# Patient Record
Sex: Female | Born: 1945
Health system: Southern US, Community
[De-identification: ages and names within clinical notes are randomized; demographics above are authoritative.]

## PROBLEM LIST (undated history)

## (undated) DIAGNOSIS — D649 Anemia, unspecified: Secondary | ICD-10-CM

## (undated) DIAGNOSIS — E785 Hyperlipidemia, unspecified: Secondary | ICD-10-CM

## (undated) DIAGNOSIS — I10 Essential (primary) hypertension: Secondary | ICD-10-CM

## (undated) HISTORY — DX: Hyperlipidemia, unspecified: E78.5

## (undated) HISTORY — DX: Anemia, unspecified: D64.9

---
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
## 1969-11-09 HISTORY — PX: BREAST EXCISIONAL BIOPSY: SUR124

## 1989-11-09 HISTORY — PX: BREAST EXCISIONAL BIOPSY: SUR124

## 1995-11-10 HISTORY — PX: LAPAROSCOPIC CHOLECYSTECTOMY: SUR755

## 2009-10-07 ENCOUNTER — Ambulatory Visit: Payer: Self-pay | Admitting: Internal Medicine

## 2009-10-07 ENCOUNTER — Other Ambulatory Visit: Admission: RE | Admit: 2009-10-07 | Discharge: 2009-10-07 | Payer: Self-pay | Admitting: Internal Medicine

## 2009-12-05 ENCOUNTER — Encounter: Admission: RE | Admit: 2009-12-05 | Discharge: 2009-12-05 | Payer: Self-pay | Admitting: Internal Medicine

## 2009-12-11 ENCOUNTER — Encounter: Admission: RE | Admit: 2009-12-11 | Discharge: 2009-12-11 | Payer: Self-pay | Admitting: Internal Medicine

## 2010-04-03 ENCOUNTER — Ambulatory Visit: Payer: Self-pay | Admitting: Internal Medicine

## 2010-06-10 ENCOUNTER — Encounter: Admission: RE | Admit: 2010-06-10 | Discharge: 2010-06-10 | Payer: Self-pay | Admitting: Internal Medicine

## 2010-10-09 ENCOUNTER — Ambulatory Visit: Payer: Self-pay | Admitting: Internal Medicine

## 2010-11-29 ENCOUNTER — Other Ambulatory Visit: Payer: Self-pay | Admitting: Internal Medicine

## 2010-11-29 DIAGNOSIS — N63 Unspecified lump in unspecified breast: Secondary | ICD-10-CM

## 2010-12-15 ENCOUNTER — Ambulatory Visit
Admission: RE | Admit: 2010-12-15 | Discharge: 2010-12-15 | Disposition: A | Discharge status: Home / Self Care | Payer: MEDICARE | Source: Ambulatory Visit | Attending: Internal Medicine | Admitting: Internal Medicine

## 2010-12-15 DIAGNOSIS — N63 Unspecified lump in unspecified breast: Secondary | ICD-10-CM

## 2011-04-13 ENCOUNTER — Ambulatory Visit (INDEPENDENT_AMBULATORY_CARE_PROVIDER_SITE_OTHER): Payer: Medicare Other | Admitting: Internal Medicine

## 2011-04-13 ENCOUNTER — Encounter: Payer: Self-pay | Admitting: Internal Medicine

## 2011-04-13 DIAGNOSIS — J40 Bronchitis, not specified as acute or chronic: Secondary | ICD-10-CM

## 2011-04-13 DIAGNOSIS — Z79899 Other long term (current) drug therapy: Secondary | ICD-10-CM

## 2011-04-13 DIAGNOSIS — J309 Allergic rhinitis, unspecified: Secondary | ICD-10-CM

## 2011-04-13 DIAGNOSIS — E785 Hyperlipidemia, unspecified: Secondary | ICD-10-CM

## 2011-04-13 MED ORDER — AMOXICILLIN-POT CLAVULANATE 500-125 MG PO TABS: 1.0000 | ORAL_TABLET | Freq: Three times a day (TID) | ORAL | Status: AC

## 2011-04-13 MED ORDER — PREDNISONE 10 MG PO KIT: PACK | ORAL | Status: DC

## 2011-04-13 NOTE — Patient Instructions (Signed)
Take antibiotic and steroid pack as directed. Call if not better in 2 weeks.

## 2011-04-13 NOTE — Progress Notes (Signed)
  Subjective:    Patient ID: Linda Sosa, female    DOB: 03/23/1946, 65 y.o.   MRN: [id number]  HPI In for 6 month recheck on Zocor for hyperlipidemia. Has developed cough that will not go away. Had URI March 2012 and was seen at [facility] and given antibiotics and cough syrup. Cough did not resolve. At first was productive of white sputum, now more of a dry cough. Some history of springtime allergies. Also, bllod pressure is elevated systolically today. She will watch this as outpatient. No prior history of HTN.    Review of Systems  HENT: Negative for congestion, rhinorrhea, sneezing and postnasal drip.   Eyes: Negative for itching.  Respiratory: Positive for cough. Negative for wheezing.        Objective:   Physical Exam  HENT:  Right Ear: External ear normal.  Left Ear: External ear normal.  Mouth/Throat: Oropharynx is clear and moist. No oropharyngeal exudate.       Boggy nasal mucosa and hoarseness when she speaks  Eyes: Right eye exhibits no discharge. Left eye exhibits no discharge.  Neck: Neck supple.  Cardiovascular: Normal rate, regular rhythm and normal heart sounds.   Pulmonary/Chest: No respiratory distress. She has no wheezes. She has no rales.  Lymphadenopathy:    She has no cervical adenopathy.          Assessment & Plan:  Protracted couch-likely bronchitis Allergic Rhinitis Hyperlipidemia-results pending Osteopenia-still on Fosamax Plan-Repeat bone density in Jan 2013. Sterapred DS 6 day 10mg  dosepack. Augmentin 500 mg tid x 10 days.

## 2011-04-14 LAB — HEPATIC FUNCTION PANEL
ALT: 13 U/L (ref 0–35)
Bilirubin, Direct: 0.1 mg/dL (ref 0.0–0.3)
Total Bilirubin: 0.4 mg/dL (ref 0.3–1.2)

## 2011-04-14 LAB — LIPID PANEL
Cholesterol: 210 mg/dL — ABNORMAL HIGH (ref 0–200)
Total CHOL/HDL Ratio: 3.8 Ratio
Triglycerides: 193 mg/dL — ABNORMAL HIGH (ref <150)
VLDL: 39 mg/dL (ref 0–40)

## 2011-04-20 ENCOUNTER — Encounter: Payer: Self-pay | Admitting: Internal Medicine

## 2011-10-14 ENCOUNTER — Other Ambulatory Visit: Payer: Self-pay | Admitting: Internal Medicine

## 2011-10-16 ENCOUNTER — Encounter: Payer: Medicare Other | Admitting: Internal Medicine

## 2011-11-20 ENCOUNTER — Ambulatory Visit (INDEPENDENT_AMBULATORY_CARE_PROVIDER_SITE_OTHER): Payer: Medicare Other | Admitting: Internal Medicine

## 2011-11-20 ENCOUNTER — Encounter: Payer: Self-pay | Admitting: Internal Medicine

## 2011-11-20 DIAGNOSIS — E785 Hyperlipidemia, unspecified: Secondary | ICD-10-CM | POA: Insufficient documentation

## 2011-11-20 DIAGNOSIS — Z79899 Other long term (current) drug therapy: Secondary | ICD-10-CM

## 2011-11-20 DIAGNOSIS — Z8639 Personal history of other endocrine, nutritional and metabolic disease: Secondary | ICD-10-CM

## 2011-11-20 DIAGNOSIS — E7800 Pure hypercholesterolemia, unspecified: Secondary | ICD-10-CM

## 2011-11-20 DIAGNOSIS — Z0000 Encounter for general adult medical examination without abnormal findings: Secondary | ICD-10-CM

## 2011-11-20 DIAGNOSIS — Z23 Encounter for immunization: Secondary | ICD-10-CM

## 2011-11-20 LAB — POCT URINALYSIS DIPSTICK
Blood, UA: NEGATIVE
Glucose, UA: NEGATIVE
Spec Grav, UA: >=1.03
Urobilinogen, UA: NEGATIVE

## 2011-11-20 NOTE — Progress Notes (Signed)
  Subjective:    Patient ID: Linda Sosa, female    DOB: 1946/10/19, 66 y.o.   MRN: [id number]  HPI  66 year old white female in today for health maintenance exam. History of osteoporosis and hyperlipidemia. Currently on Fosamax and generic Zocor. Had influenza immunization and drugstore October 2012. Never had Pneumovax immunization. This will be administered today. Prescription given for Zostavax vaccine to be done if drugstore. Never had colonoscopy. Patient declines. Does agree to do 3 Hemoccult cards. Will be due for mammogram and bone density study February 2013. Order given for these. No other complaints or problems. Denies being depressed or having a fall risk. Family history is unchanged.   Review of Systems  Constitutional: Negative.   HENT: Negative.   Eyes: Negative.   Respiratory: Negative.   Cardiovascular: Negative.   Gastrointestinal: Negative.   Genitourinary: Negative.   Musculoskeletal: Negative.   Neurological: Negative.   Hematological: Negative.   Psychiatric/Behavioral: Negative.        Objective:   Physical Exam  Nursing note and vitals reviewed. Constitutional: She is oriented to person, place, and time. She appears well-nourished. No distress.  HENT:  Head: Normocephalic and atraumatic.  Right Ear: External ear normal.  Left Ear: External ear normal.  Mouth/Throat: No oropharyngeal exudate.  Eyes: Conjunctivae and EOM are normal. Pupils are equal, round, and reactive to light. No scleral icterus.  Neck: Neck supple. No JVD present. No thyromegaly present.  Cardiovascular: Normal rate, regular rhythm, normal heart sounds and intact distal pulses.   No murmur heard. Pulmonary/Chest: Effort normal and breath sounds normal. She has no rales.       Breasts normal female some linear thickening at 12:00 in right breast  Abdominal: Soft. Bowel sounds are normal. She exhibits no distension and no mass. There is no tenderness. There is no rebound and no  guarding.  Genitourinary:       Bimanual exam is normal. Had Pap smear 2010. Defer Pap until 2013.  Musculoskeletal: Normal range of motion. She exhibits no edema.  Lymphadenopathy:    She has no cervical adenopathy.  Neurological: She is alert and oriented to person, place, and time. She has normal reflexes. No cranial nerve deficit.  Skin: Skin is warm and dry. No rash noted.  Psychiatric: She has a normal mood and affect. Her behavior is normal. Judgment and thought content normal.       Quiet and reserved          Assessment & Plan:  Hyperlipidemia  Osteoporosis  Obesity  Plan: Pneumovax immunization, 3 Hemoccult cards distributed, order for mammogram and bone density study. Continue Fosamax and Zocor. Fasting labs drawn and are pending. Had tetanus immunization 2011. Declines colonoscopy. Had influenza immunization October 2012. Return in 6 months for office visit fasting lipid panel and liver functions.

## 2011-11-20 NOTE — Patient Instructions (Signed)
He had been given pneumococcal vaccine today. Please get Zostavax vaccine if drugstore. Continue Fosamax and Zocor. Return in 6 months.

## 2011-11-21 LAB — CBC WITH DIFFERENTIAL/PLATELET
Eosinophils Absolute: 0.1 10*3/uL (ref 0.0–0.7)
Eosinophils Relative: 2 % (ref 0–5)
Hemoglobin: 12.7 g/dL (ref 12.0–15.0)
Lymphs Abs: 2.2 10*3/uL (ref 0.7–4.0)
MCH: 27.5 pg (ref 26.0–34.0)
MCV: 87.4 fL (ref 78.0–100.0)
Monocytes Absolute: 0.5 10*3/uL (ref 0.1–1.0)
Monocytes Relative: 7 % (ref 3–12)
Platelets: 256 10*3/uL (ref 150–400)
RBC: 4.61 MIL/uL (ref 3.87–5.11)

## 2011-11-21 LAB — COMPREHENSIVE METABOLIC PANEL
BUN: 18 mg/dL (ref 6–23)
CO2: 26 mEq/L (ref 19–32)
Creat: 0.64 mg/dL (ref 0.50–1.10)
Glucose, Bld: 99 mg/dL (ref 70–99)
Total Bilirubin: 0.5 mg/dL (ref 0.3–1.2)

## 2011-11-21 LAB — LIPID PANEL
Cholesterol: 172 mg/dL (ref 0–200)
Total CHOL/HDL Ratio: 3.2 Ratio
Triglycerides: 150 mg/dL — ABNORMAL HIGH (ref <150)
VLDL: 30 mg/dL (ref 0–40)

## 2011-11-24 ENCOUNTER — Other Ambulatory Visit: Payer: Self-pay | Admitting: Internal Medicine

## 2011-11-30 ENCOUNTER — Other Ambulatory Visit: Payer: Self-pay | Admitting: Internal Medicine

## 2011-11-30 DIAGNOSIS — Z780 Asymptomatic menopausal state: Secondary | ICD-10-CM

## 2011-11-30 DIAGNOSIS — Z1231 Encounter for screening mammogram for malignant neoplasm of breast: Secondary | ICD-10-CM

## 2011-12-24 ENCOUNTER — Ambulatory Visit
Admission: RE | Admit: 2011-12-24 | Discharge: 2011-12-24 | Disposition: A | Discharge status: Home / Self Care | Payer: Medicare Other | Source: Ambulatory Visit | Attending: Internal Medicine | Admitting: Internal Medicine

## 2011-12-24 DIAGNOSIS — Z1231 Encounter for screening mammogram for malignant neoplasm of breast: Secondary | ICD-10-CM

## 2011-12-24 DIAGNOSIS — Z780 Asymptomatic menopausal state: Secondary | ICD-10-CM

## 2012-01-08 ENCOUNTER — Ambulatory Visit (INDEPENDENT_AMBULATORY_CARE_PROVIDER_SITE_OTHER): Payer: Medicare Other | Admitting: Internal Medicine

## 2012-01-08 ENCOUNTER — Encounter: Payer: Self-pay | Admitting: Internal Medicine

## 2012-01-08 DIAGNOSIS — E785 Hyperlipidemia, unspecified: Secondary | ICD-10-CM

## 2012-01-08 DIAGNOSIS — M810 Age-related osteoporosis without current pathological fracture: Secondary | ICD-10-CM

## 2012-01-08 DIAGNOSIS — Z87898 Personal history of other specified conditions: Secondary | ICD-10-CM

## 2012-01-08 DIAGNOSIS — Z8639 Personal history of other endocrine, nutritional and metabolic disease: Secondary | ICD-10-CM

## 2012-01-09 ENCOUNTER — Encounter: Payer: Self-pay | Admitting: Internal Medicine

## 2012-01-09 DIAGNOSIS — M810 Age-related osteoporosis without current pathological fracture: Secondary | ICD-10-CM | POA: Insufficient documentation

## 2012-01-09 DIAGNOSIS — Z8639 Personal history of other endocrine, nutritional and metabolic disease: Secondary | ICD-10-CM | POA: Insufficient documentation

## 2012-01-09 NOTE — Progress Notes (Signed)
  Subjective:    Patient ID: Linda Sosa, female    DOB: [birth year], 66 y.o.   MRN: [id number]  HPI 66 year old white female with history of osteoporosis, vitamin D deficiency and hyperlipidemia currently on Fosamax 70 mg weekly since January 2011. Takes vitamin D supplement 2000 units daily. Is on Zocor 10 mg daily. Had physical examination January 2013 and I recommended bone density study at that time. Here today to followup on bone density study.  Patient tells me husband has non-Hodgkin's lymphoma and is under the care of Dr. Cyndie Chime  Bone density study 2011 showed T score of -2.6 in femoral neck, -3.4 and LS-spine. Bone density study done December 24, 2011 shows T score -3.5 and LS-spine and -2.7 in femoral neck.  Patient had mammogram February 2012. Had Pneumovax vaccine January 2013. Tdap vaccine October 2012.    Review of Systems     Objective:   Physical Exam not examined today. 20 minute discussion of bone density study results, vitamin D deficiency and osteoporosis        Assessment & Plan:  Osteoporosis  Hyperlipidemia  Vitamin D deficiency  Plan: Continue vitamin D supplement daily. Continue Fosamax 70 mg weekly (started 2011). Bone density study shows stability in osteoporosis with use of Fosamax in other words no worsening of bone density from 2011 which is encouraging. Repeat bone density study in 2 years. Return here summer 2013 for fasting lipid panel liver functions and office visit.

## 2012-01-09 NOTE — Patient Instructions (Signed)
Continue Fosamax as prescribed. Return in summer 2013 for six-month recheck, lipid panel liver functions.

## 2012-01-22 ENCOUNTER — Other Ambulatory Visit: Payer: Self-pay | Admitting: Internal Medicine

## 2012-05-05 ENCOUNTER — Other Ambulatory Visit: Payer: Self-pay | Admitting: Internal Medicine

## 2012-05-16 ENCOUNTER — Encounter: Payer: Self-pay | Admitting: Internal Medicine

## 2012-05-16 ENCOUNTER — Ambulatory Visit (INDEPENDENT_AMBULATORY_CARE_PROVIDER_SITE_OTHER): Payer: Medicare Other | Admitting: Internal Medicine

## 2012-05-16 VITALS — BP 146/78 | HR 80 | Temp 98.0°F | Ht 60.25 in | Wt 172.0 lb

## 2012-05-16 DIAGNOSIS — E785 Hyperlipidemia, unspecified: Secondary | ICD-10-CM

## 2012-05-16 DIAGNOSIS — Z79899 Other long term (current) drug therapy: Secondary | ICD-10-CM

## 2012-05-16 DIAGNOSIS — Z8639 Personal history of other endocrine, nutritional and metabolic disease: Secondary | ICD-10-CM

## 2012-05-16 DIAGNOSIS — Z87898 Personal history of other specified conditions: Secondary | ICD-10-CM

## 2012-05-16 DIAGNOSIS — M810 Age-related osteoporosis without current pathological fracture: Secondary | ICD-10-CM

## 2012-05-17 LAB — LIPID PANEL
HDL: 49 mg/dL (ref >39)
Total CHOL/HDL Ratio: 3.6 Ratio
VLDL: 41 mg/dL — ABNORMAL HIGH (ref 0–40)

## 2012-05-17 LAB — HEPATIC FUNCTION PANEL
ALT: 15 U/L (ref 0–35)
Albumin: 4.3 g/dL (ref 3.5–5.2)
Bilirubin, Direct: 0.1 mg/dL (ref 0.0–0.3)
Total Bilirubin: 0.4 mg/dL (ref 0.3–1.2)

## 2012-05-19 ENCOUNTER — Ambulatory Visit: Payer: Self-pay | Admitting: Internal Medicine

## 2012-06-04 NOTE — Progress Notes (Signed)
  Subjective:    Patient ID: [name], female    DOB: 1946-05-12, 66 y.o.   MRN: [id number]  HPI 66 year old white female with history of hyperlipidemia, osteoporosis and history of vitamin D deficiency in today for six-month followup. She is on Fosamax, Zocor, vitamin D supplement. No complaints or problems. She also takes 81 mg of aspirin daily. Had mammogram February 2013. Had Zostavax vaccine 2012. Bone density study January 2013.    Review of Systems     Objective:   Physical Exam Skin is pale warm and dry. Nodes none. HEENT exam TMs and pharynx are clear. Neck is supple without adenopathy thyromegaly or carotid bruits. Chest clear to auscultation. Cardiac exam regular rate and rhythm normal S1 and S2. Extremities without edema. She is alert and oriented x3.         Assessment & Plan:  Hyperlipidemia  Osteoporosis  History of vitamin D deficiency  Plan: Fasting lipid panel liver functions will be reviewed. Return in 67 months for physical examination. Recommend repeat bone density study in 2014.

## 2012-06-04 NOTE — Patient Instructions (Addendum)
Continue same medications and return for physical exam in 6 months 

## 2012-08-11 ENCOUNTER — Other Ambulatory Visit: Payer: Self-pay | Admitting: Internal Medicine

## 2012-11-17 ENCOUNTER — Other Ambulatory Visit: Payer: Self-pay | Admitting: Internal Medicine

## 2012-11-17 ENCOUNTER — Other Ambulatory Visit: Payer: Medicare Other | Admitting: Internal Medicine

## 2012-11-17 DIAGNOSIS — M810 Age-related osteoporosis without current pathological fracture: Secondary | ICD-10-CM

## 2012-11-17 DIAGNOSIS — E785 Hyperlipidemia, unspecified: Secondary | ICD-10-CM

## 2012-11-17 DIAGNOSIS — E559 Vitamin D deficiency, unspecified: Secondary | ICD-10-CM

## 2012-11-17 DIAGNOSIS — Z79899 Other long term (current) drug therapy: Secondary | ICD-10-CM

## 2012-11-17 LAB — CBC WITH DIFFERENTIAL/PLATELET
Basophils Absolute: 0 10*3/uL (ref 0.0–0.1)
Eosinophils Relative: 2 % (ref 0–5)
HCT: 33.1 % — ABNORMAL LOW (ref 36.0–46.0)
Lymphocytes Relative: 30 % (ref 12–46)
Lymphs Abs: 2.1 10*3/uL (ref 0.7–4.0)
MCV: 74.9 fL — ABNORMAL LOW (ref 78.0–100.0)
Neutro Abs: 4.3 10*3/uL (ref 1.7–7.7)
Platelets: 281 10*3/uL (ref 150–400)
RBC: 4.42 MIL/uL (ref 3.87–5.11)
WBC: 7 10*3/uL (ref 4.0–10.5)

## 2012-11-17 LAB — COMPREHENSIVE METABOLIC PANEL
ALT: 14 U/L (ref 0–35)
AST: 19 U/L (ref 0–37)
Albumin: 4.4 g/dL (ref 3.5–5.2)
CO2: 26 mEq/L (ref 19–32)
Calcium: 9.6 mg/dL (ref 8.4–10.5)
Chloride: 104 mEq/L (ref 96–112)
Creat: 0.65 mg/dL (ref 0.50–1.10)
Potassium: 4.3 mEq/L (ref 3.5–5.3)
Total Protein: 6.7 g/dL (ref 6.0–8.3)

## 2012-11-17 LAB — VITAMIN D 25 HYDROXY (VIT D DEFICIENCY, FRACTURES): Vit D, 25-Hydroxy: 59 ng/mL (ref 30–89)

## 2012-11-17 LAB — TSH: TSH: 1.637 u[IU]/mL (ref 0.350–4.500)

## 2012-11-17 LAB — LIPID PANEL: Cholesterol: 169 mg/dL (ref 0–200)

## 2012-11-18 ENCOUNTER — Ambulatory Visit (INDEPENDENT_AMBULATORY_CARE_PROVIDER_SITE_OTHER): Payer: Medicare Other | Admitting: Internal Medicine

## 2012-11-18 ENCOUNTER — Encounter: Payer: Self-pay | Admitting: Internal Medicine

## 2012-11-18 ENCOUNTER — Other Ambulatory Visit (HOSPITAL_COMMUNITY)
Admission: RE | Admit: 2012-11-18 | Discharge: 2012-11-18 | Disposition: A | Discharge status: Home / Self Care | Payer: Medicare Other | Source: Ambulatory Visit | Attending: Internal Medicine | Admitting: Internal Medicine

## 2012-11-18 VITALS — BP 150/76 | HR 80 | Temp 98.5°F | Ht 61.0 in | Wt 172.0 lb

## 2012-11-18 DIAGNOSIS — R030 Elevated blood-pressure reading, without diagnosis of hypertension: Secondary | ICD-10-CM

## 2012-11-18 DIAGNOSIS — M810 Age-related osteoporosis without current pathological fracture: Secondary | ICD-10-CM

## 2012-11-18 DIAGNOSIS — Z124 Encounter for screening for malignant neoplasm of cervix: Secondary | ICD-10-CM

## 2012-11-18 DIAGNOSIS — D509 Iron deficiency anemia, unspecified: Secondary | ICD-10-CM

## 2012-11-18 DIAGNOSIS — D649 Anemia, unspecified: Secondary | ICD-10-CM | POA: Insufficient documentation

## 2012-11-18 DIAGNOSIS — Z01419 Encounter for gynecological examination (general) (routine) without abnormal findings: Secondary | ICD-10-CM | POA: Insufficient documentation

## 2012-11-18 DIAGNOSIS — E785 Hyperlipidemia, unspecified: Secondary | ICD-10-CM

## 2012-11-18 DIAGNOSIS — I10 Essential (primary) hypertension: Secondary | ICD-10-CM

## 2012-11-18 LAB — IRON AND TIBC
TIBC: 511 ug/dL — ABNORMAL HIGH (ref 250–470)
UIBC: 481 ug/dL — ABNORMAL HIGH (ref 125–400)

## 2012-11-18 LAB — POCT URINALYSIS DIPSTICK
Bilirubin, UA: NEGATIVE
Blood, UA: NEGATIVE
Glucose, UA: NEGATIVE
Leukocytes, UA: NEGATIVE
Nitrite, UA: NEGATIVE
Urobilinogen, UA: NEGATIVE

## 2012-11-18 MED ORDER — SIMVASTATIN 10 MG PO TABS: 10.0000 mg | ORAL_TABLET | Freq: Every day | ORAL | Status: DC

## 2012-11-18 NOTE — Progress Notes (Signed)
Subjective:    Patient ID: Linda Sosa, female    DOB: Nov 02, 1946, 67 y.o.   MRN: [id number]  HPI 67 year old White female with hyperlipidemia on statin therapy in for health maintenance and evaluation of medical problems. Has developed microcytic anemia since last CBC. No history of melena, bright red blood per rectum, epigastric pain. Has never had colonoscopy but have discussed it previously but she declined it. Does not take daily NSAIDS occasionally will take Aleve for back pain once in a while.  Also, history of osteoporosis and hyperlipidemia. Treated with Fosamax and generic Zocor. Pneumovax immunization done 01/09/2012. At that time was given 3 Hemoccult cards. She had never had a colonoscopy. History of vitamin D deficiency. Had bone density study in January 2013, mammogram February 2013, Zostavax vaccine 2012.  Patient's husband has history of non-Hodgkin's lymphoma and is under the care of Dr. Cyndie Chime.  Patient had tetanus vaccine October 2012. Bone density study 2011 showed T score of -2.6 in femoral neck, -3.4 in the LS-spine. Bone density study done 12/24/2011 shows T score -3.5 in LS-spine and -2.7 in femoral neck. Had mammogram 2013.  No known drug allergies.  Family history: Father died at age 80 with heart problems and history of diabetes. Mother died at age 40 of ovarian cancer. One brother died with heart problems. 2 sisters in fairly good health but one is a diabetic. Patient has one daughter age 62 in good health.  Social history: Patient does not smoke or consume alcohol. Completed 2 years of college. Does not work outside the home. Enjoys gardening and traveling.  Patient had cholecystectomy 1997. Started Fosamax in January 2011. Started Zocor in may 2011. Was found to be vitamin D deficient and decerebrate 2010. Normal Pap smear November 2010.     Review of Systems  Constitutional: Negative.   HENT: Negative.   Eyes: Negative.   Cardiovascular: Negative.     Gastrointestinal: Negative.   Genitourinary: Negative.   Musculoskeletal: Negative.   Skin: Negative.   Allergic/Immunologic: Negative.   Hematological: Negative.   Psychiatric/Behavioral: Negative.        Objective:   Physical Exam  Vitals reviewed. Constitutional: She is oriented to person, place, and time. She appears well-developed and well-nourished. No distress.  HENT:  Head: Normocephalic and atraumatic.  Right Ear: External ear normal.  Left Ear: External ear normal.  Mouth/Throat: Oropharynx is clear and moist. No oropharyngeal exudate.  Eyes: Conjunctivae and EOM are normal. Pupils are equal, round, and reactive to light. Right eye exhibits no discharge. Left eye exhibits no discharge. No scleral icterus.  Neck: Neck supple. No JVD present. No thyromegaly present.  Cardiovascular: Normal rate, normal heart sounds and intact distal pulses.   No murmur heard. Pulmonary/Chest: Effort normal and breath sounds normal. She has no wheezes. She has no rales.  Breasts normal female  Abdominal: Soft. Bowel sounds are normal. She exhibits no distension and no mass. There is no tenderness. There is no rebound and no guarding.  Genitourinary:  deferred  Musculoskeletal: Normal range of motion. She exhibits no edema.  Lymphadenopathy:    She has no cervical adenopathy.  Neurological: She is alert and oriented to person, place, and time. She has normal reflexes. No cranial nerve deficit. Coordination normal.  Skin: Skin is warm and dry. No rash noted. She is not diaphoretic.  Psychiatric: She has a normal mood and affect. Her behavior is normal. Judgment and thought content normal.  Assessment & Plan:  Anemia-basically has vegetarian diet. Never had colonoscopy. Needs screening colonoscopy in face of anemia. Start iron sulfate 325 mg twice daily. Return in 6 weeks. GI consultation for colonoscopy.  Hyperlipidemia-stable on statin therapy  Osteoporosis treated with  Fosamax. No history of epigastric distress on Fosamax. Been on Fosamax since 2011.  History of vitamin D deficiency treated with over-the-counter supplementation  Elevated blood pressure-patient to take blood pressure readings several times weekly and return in 6 weeks for reevaluation.         Subjective:   Patient presents for Medicare Annual/Subsequent preventive examination.   Review Past Medical/Family/Social: see EPIC   Risk Factors  Current exercise habits: walk and garden Dietary issues discussed: low fat low carb  Cardiac risk factors:  Depression Screen  (Note: if answer to either of the following is "Yes", a more complete depression screening is indicated)   Over the past two weeks, have you felt down, depressed or hopeless? No  Over the past two weeks, have you felt little interest or pleasure in doing things? No Have you lost interest or pleasure in daily life? No Do you often feel hopeless? No Do you cry easily over simple problems? No   Activities of Daily Living  In your present state of health, do you have any difficulty performing the following activities?:   Driving? No  Managing money? No  Feeding yourself? No  Getting from bed to chair? No  Climbing a flight of stairs? No  Preparing food and eating?: No  Bathing or showering? No  Getting dressed: No  Getting to the toilet? No  Using the toilet:No  Moving around from place to place: No  In the past year have you fallen or had a near fall?:No  Are you sexually active? No  Do you have more than one partner? No   Hearing Difficulties: No  Do you often ask people to speak up or repeat themselves? No  Do you experience ringing or noises in your ears? No  Do you have difficulty understanding soft or whispered voices? No  Do you feel that you have a problem with memory? No Do you often misplace items? No    Home Safety:  Do you have a smoke alarm at your residence? Yes Do you have grab bars  in the bathroom?yes  Do you have throw rugs in your house?no   Cognitive Testing  Alert? Yes Normal Appearance?Yes  Oriented to person? Yes Place? Yes  Time? Yes  Recall of three objects? Yes  Can perform simple calculations? Yes  Displays appropriate judgment?Yes  Can read the correct time from a watch face?Yes   List the Names of Other Physician/Practitioners you currently use:  See referral list for the physicians patient is currently seeing. Dermatologist: Dr. Terri Piedra; Optometrist at mall    Review of Systems: See above   Objective:     General appearance: Appears stated age and mildly obese  Head: Normocephalic, without obvious abnormality, atraumatic  Eyes: conj clear, EOMi PEERLA  Ears: normal TM's and external ear canals both ears  Nose: Nares normal. Septum midline. Mucosa normal. No drainage or sinus tenderness.  Throat: lips, mucosa, and tongue normal; teeth and gums normal  Neck: no adenopathy, no carotid bruit, no JVD, supple, symmetrical, trachea midline and thyroid not enlarged, symmetric, no tenderness/mass/nodules  No CVA tenderness.  Lungs: clear to auscultation bilaterally  Breasts: normal appearance, no masses or tenderness, Heart: regular rate and rhythm, S1, S2 normal, no  murmur, click, rub or gallop  Abdomen: soft, non-tender; bowel sounds normal; no masses, no organomegaly  Musculoskeletal: ROM normal in all joints, no crepitus, no deformity, Normal muscle strengthen. Back  is symmetric, no curvature. Skin: Skin color, texture, turgor normal. No rashes or lesions  Lymph nodes: Cervical, supraclavicular, and axillary nodes normal.  Neurologic: CN 2 -12 Normal, Normal symmetric reflexes. Normal coordination and gait  Psych: Alert & Oriented x 3, Mood appear stable.    Assessment:    Annual wellness medicare exam   Plan:    During the course of the visit the patient was educated and counseled about appropriate screening and preventive services  including:  Mammogram due Never had colonoscopy - will arrange Had flu vaccine       Patient Instructions (the written plan) was given to the patient.  Medicare Attestation  I have personally reviewed:  The patient's medical and social history  Their use of alcohol, tobacco or illicit drugs  Their current medications and supplements  The patient's functional ability including ADLs,fall risks, home safety risks, cognitive, and hearing and visual impairment  Diet and physical activities  Evidence for depression or mood disorders  The patient's weight, height, BMI, and visual acuity have been recorded in the chart. I have made referrals, counseling, and provided education to the patient based on review of the above and I have provided the patient with a written personalized care plan for preventive services.

## 2012-11-21 ENCOUNTER — Telehealth: Payer: Self-pay

## 2012-11-21 ENCOUNTER — Encounter: Payer: Self-pay | Admitting: Gastroenterology

## 2012-11-21 DIAGNOSIS — Z1211 Encounter for screening for malignant neoplasm of colon: Secondary | ICD-10-CM

## 2012-11-21 DIAGNOSIS — D509 Iron deficiency anemia, unspecified: Secondary | ICD-10-CM

## 2012-11-22 NOTE — Progress Notes (Signed)
Patient informed, and scheduled for colonoscopy on 12/14/2012 at 1000am with Dr. Jarold Motto. Pre-op visit 11/30/2012 at 2:30pm

## 2012-11-22 NOTE — Progress Notes (Signed)
Patient informed. 

## 2012-11-22 NOTE — Telephone Encounter (Signed)
Patient informed. 

## 2012-11-30 ENCOUNTER — Ambulatory Visit (AMBULATORY_SURGERY_CENTER): Payer: Medicare Other | Admitting: *Deleted

## 2012-11-30 ENCOUNTER — Telehealth: Payer: Self-pay | Admitting: *Deleted

## 2012-11-30 VITALS — Ht 61.0 in | Wt 171.4 lb

## 2012-11-30 DIAGNOSIS — D649 Anemia, unspecified: Secondary | ICD-10-CM

## 2012-11-30 MED ORDER — MOVIPREP 100 G PO SOLR: ORAL | Status: DC

## 2012-11-30 NOTE — Telephone Encounter (Signed)
Pt notified to proceed with colonoscopy as scheduled 

## 2012-11-30 NOTE — Telephone Encounter (Signed)
Direct is ok 

## 2012-11-30 NOTE — Telephone Encounter (Signed)
Dr Jarold Motto: pt is scheduled for direct colonoscopy 2/5; referred by Dr. Marlan Palau for new diagnosis of anemia. Pt has never had colonoscopy.  Is she okay for direct colonoscopy or does she need ov with you before scheduling colon?  Thanks, Olegario Messier

## 2012-12-14 ENCOUNTER — Encounter: Payer: Self-pay | Admitting: Gastroenterology

## 2012-12-14 ENCOUNTER — Ambulatory Visit (AMBULATORY_SURGERY_CENTER): Payer: Medicare Other | Admitting: Gastroenterology

## 2012-12-14 VITALS — BP 134/75 | HR 84 | Temp 98.1°F | Resp 18 | Ht 61.0 in | Wt 171.0 lb

## 2012-12-14 DIAGNOSIS — D649 Anemia, unspecified: Secondary | ICD-10-CM

## 2012-12-14 DIAGNOSIS — K5730 Diverticulosis of large intestine without perforation or abscess without bleeding: Secondary | ICD-10-CM

## 2012-12-14 MED ORDER — SODIUM CHLORIDE 0.9 % IV SOLN: 500.0000 mL | INTRAVENOUS | Status: DC

## 2012-12-14 NOTE — Progress Notes (Signed)
Patient did not experience any of the following events: a burn prior to discharge; a fall within the facility; wrong site/side/patient/procedure/implant event; or a hospital transfer or hospital admission upon discharge from the facility. (G8907) Patient did not have preoperative order for IV antibiotic SSI prophylaxis. (G8918)  

## 2012-12-14 NOTE — Patient Instructions (Signed)
YOU HAD AN ENDOSCOPIC PROCEDURE TODAY AT THE [facility]: Refer to the procedure report that was given to you for any specific questions about what was found during the examination.  If the procedure report does not answer your questions, please call your gastroenterologist to clarify.  If you requested that your care partner not be given the details of your procedure findings, then the procedure report has been included in a sealed envelope for you to review at your convenience later.  YOU SHOULD EXPECT: Some feelings of bloating in the abdomen. Passage of more gas than usual.  Walking can help get rid of the air that was put into your GI tract during the procedure and reduce the bloating. If you had a lower endoscopy (such as a colonoscopy or flexible sigmoidoscopy) you may notice spotting of blood in your stool or on the toilet paper. If you underwent a bowel prep for your procedure, then you may not have a normal bowel movement for a few days.  DIET: Your first meal following the procedure should be a light meal and then it is ok to progress to your normal diet.  A half-sandwich or bowl of soup is an example of a good first meal.  Heavy or fried foods are harder to digest and may make you feel nauseous or bloated.  Likewise meals heavy in dairy and vegetables can cause extra gas to form and this can also increase the bloating.  Drink plenty of fluids but you should avoid alcoholic beverages for 24 hours.  ACTIVITY: Your care partner should take you home directly after the procedure.  You should plan to take it easy, moving slowly for the rest of the day.  You can resume normal activity the day after the procedure however you should NOT DRIVE or use heavy machinery for 24 hours (because of the sedation medicines used during the test).    SYMPTOMS TO REPORT IMMEDIATELY: A gastroenterologist can be reached at any hour.  During normal business hours, 8:30 AM to 5:00 PM Monday through Friday,  call [phone].  After hours and on weekends, please call the GI answering service at [phone] who will take a message and have the physician on call contact you.   Following lower endoscopy (colonoscopy or flexible sigmoidoscopy):  Excessive amounts of blood in the stool  Significant tenderness or worsening of abdominal pains  Swelling of the abdomen that is new, acute  Fever of 100F or higher    FOLLOW UP: If any biopsies were taken you will be contacted by phone or by letter within the next 1-3 weeks.  Call your gastroenterologist if you have not heard about the biopsies in 3 weeks.  Our staff will call the home number listed on your records the next business day following your procedure to check on you and address any questions or concerns that you may have at that time regarding the information given to you following your procedure. This is a courtesy call and so if there is no answer at the home number and we have not heard from you through the emergency physician on call, we will assume that you have returned to your regular daily activities without incident.  SIGNATURES/CONFIDENTIALITY: You and/or your care partner have signed paperwork which will be entered into your electronic medical record.  These signatures attest to the fact that that the information above on your After Visit Summary has been reviewed and is understood.  Full responsibility of the confidentiality   of this discharge information lies with you and/or your care-partner.     Information on diverticulosis & high fiber diet given to you today 

## 2012-12-14 NOTE — Op Note (Signed)
[facility] [address] [place], [address]   COLONOSCOPY PROCEDURE REPORT  PATIENT: Linda Sosa, Linda Sosa  MR#: [id number] BIRTHDATE: 09-Nov-1946 , 66  yrs. old GENDER: Female ENDOSCOPIST: Mardella Layman, MD, Clementeen Graham REFERRED BY:  Sharlet Salina, M.D. PROCEDURE DATE:  12/14/2012 PROCEDURE:   Colonoscopy, screening ASA CLASS:   Class II INDICATIONS:Average risk patient for colon cancer. MEDICATIONS: propofol (Diprivan) 200mg  IV  DESCRIPTION OF PROCEDURE:   After the risks and benefits and of the procedure were explained, informed consent was obtained.  A digital rectal exam revealed no abnormalities of the rectum.    The LB [id number]  endoscope was introduced through the anus and advanced to the cecum, which was identified by both the appendix and ileocecal valve .  The quality of the prep was excellent, using MoviPrep .  The instrument was then slowly withdrawn as the colon was fully examined.     COLON FINDINGS: There was mild diverticulosis noted in the sigmoid colon with associated tortuosity.   The colon was otherwise normal. There was no diverticulosis, inflammation, polyps or cancers unless previously stated. A small cecal lipoma was noted. Retroflexed views revealed no abnormalities.     The scope was then withdrawn from the patient and the procedure completed.  COMPLICATIONS: There were no complications. ENDOSCOPIC IMPRESSION: 1.   There was mild diverticulosis noted in the sigmoid colon 2.   The colon was otherwise normal,no polyps noted  RECOMMENDATIONS: 1.  High fiber diet 2.  Continue current colorectal screening recommendations for "routine risk" patients with a repeat colonoscopy in 10 years.   REPEAT EXAM:  cc:  _______________________________ eSignedMardella Layman, MD, [facility] 12/14/2012 10:33 AM

## 2012-12-15 ENCOUNTER — Telehealth: Payer: Self-pay

## 2012-12-15 ENCOUNTER — Other Ambulatory Visit: Payer: Self-pay | Admitting: Internal Medicine

## 2012-12-15 DIAGNOSIS — Z1231 Encounter for screening mammogram for malignant neoplasm of breast: Secondary | ICD-10-CM

## 2012-12-15 NOTE — Telephone Encounter (Signed)
  Follow up Call-  Call back number 12/14/2012  Post procedure Call Back phone  # [phone]  Permission to leave phone message No     Patient questions:  Do you have a fever, pain , or abdominal swelling? no Pain Score  0 *  Have you tolerated food without any problems? yes  Have you been able to return to your normal activities? yes  Do you have any questions about your discharge instructions: Diet   no Medications  no Follow up visit  no  Do you have questions or concerns about your Care? no  Actions: * If pain score is 4 or above: No action needed, pain <4.

## 2012-12-20 ENCOUNTER — Ambulatory Visit: Payer: Medicare Other | Admitting: Internal Medicine

## 2013-01-04 ENCOUNTER — Other Ambulatory Visit: Payer: Self-pay | Admitting: Internal Medicine

## 2013-01-05 ENCOUNTER — Encounter: Payer: Self-pay | Admitting: Internal Medicine

## 2013-01-05 ENCOUNTER — Ambulatory Visit (INDEPENDENT_AMBULATORY_CARE_PROVIDER_SITE_OTHER): Payer: Medicare Other | Admitting: Internal Medicine

## 2013-01-05 VITALS — BP 140/72 | HR 80 | Temp 98.1°F | Wt 172.0 lb

## 2013-01-05 DIAGNOSIS — I10 Essential (primary) hypertension: Secondary | ICD-10-CM

## 2013-01-05 DIAGNOSIS — D509 Iron deficiency anemia, unspecified: Secondary | ICD-10-CM

## 2013-01-05 LAB — CBC WITH DIFFERENTIAL/PLATELET
Basophils Absolute: 0 10*3/uL (ref 0.0–0.1)
Basophils Relative: 0 % (ref 0–1)
HCT: 37.5 % (ref 36.0–46.0)
Hemoglobin: 11.9 g/dL — ABNORMAL LOW (ref 12.0–15.0)
Lymphocytes Relative: 31 % (ref 12–46)
Monocytes Absolute: 0.4 10*3/uL (ref 0.1–1.0)
Neutro Abs: 4.6 10*3/uL (ref 1.7–7.7)
Neutrophils Relative %: 62 % (ref 43–77)
RDW: 17.3 % — ABNORMAL HIGH (ref 11.5–15.5)
WBC: 7.5 10*3/uL (ref 4.0–10.5)

## 2013-01-05 LAB — IRON: Iron: 55 ug/dL (ref 42–145)

## 2013-01-05 NOTE — Progress Notes (Signed)
  Subjective:    Patient ID: Linda Sosa, female    DOB: 02-10-1946, 67 y.o.   MRN: [id number]  HPI Patient had her first colonoscopy ever for evaluation of recently discovered iron deficiency anemia by Dr. Jarold Motto . No etiology for anemia found. She had diverticulosis. Brings in multiple blood pressure readings at my request. Had elevated systolic blood pressure at last visit. Seems to have labile blood pressure readings. Blood pressures have ranged from 100/61 to 156/78 over the past few weeks at home. She has a history of hyperlipidemia, osteoporosis, vitamin D deficiency. She is basically a vegetarian.   She has been on iron sulfate twice daily since anemia was discovered in January. CBC was drawn today.    Review of Systems     Objective:   Physical Exam skin pale warm and dry; chest clear to auscultation; cardiac exam regular rate and rhythm normal S1 and S2; extremities without edema; neck supple without JVD thyromegaly or carotid bruits.        Assessment & Plan:  Labile hypertension  Hyperlipidemia-stable on statin  Microcytic anemia-likely due to vegetarian diet. GI workup negative.  Plan: Begin Altace 5 mg daily for hypertension and return in 6 weeks for office visit blood pressure check and basic metabolic panel. May decrease blood pressure readings to 2-3 times weekly.  Time spent with patient 25 minutes .

## 2013-01-05 NOTE — Patient Instructions (Addendum)
We will arrange for colonoscopy evaluation. Begin iron sulfate 325 mg twice daily and return in 6 weeks.

## 2013-01-05 NOTE — Patient Instructions (Addendum)
Decrease iron sulfate to once daily if CBC shows significant improvement in anemia. Continue statin medication for hyperlipidemia. Again Altace 5 mg daily for hypertension and return in 6 weeks

## 2013-01-19 ENCOUNTER — Ambulatory Visit
Admission: RE | Admit: 2013-01-19 | Discharge: 2013-01-19 | Disposition: A | Discharge status: Home / Self Care | Payer: Medicare Other | Source: Ambulatory Visit | Attending: Internal Medicine | Admitting: Internal Medicine

## 2013-02-21 ENCOUNTER — Other Ambulatory Visit: Payer: Medicare Other | Admitting: Internal Medicine

## 2013-02-21 DIAGNOSIS — I10 Essential (primary) hypertension: Secondary | ICD-10-CM

## 2013-02-21 DIAGNOSIS — D509 Iron deficiency anemia, unspecified: Secondary | ICD-10-CM

## 2013-02-21 LAB — CBC WITH DIFFERENTIAL/PLATELET
Eosinophils Absolute: 0.1 10*3/uL (ref 0.0–0.7)
Eosinophils Relative: 2 % (ref 0–5)
HCT: 37.9 % (ref 36.0–46.0)
Lymphocytes Relative: 32 % (ref 12–46)
Lymphs Abs: 2.4 10*3/uL (ref 0.7–4.0)
MCH: 26.3 pg (ref 26.0–34.0)
MCV: 80.5 fL (ref 78.0–100.0)
Monocytes Absolute: 0.5 10*3/uL (ref 0.1–1.0)
Monocytes Relative: 7 % (ref 3–12)
RBC: 4.71 MIL/uL (ref 3.87–5.11)
WBC: 7.5 10*3/uL (ref 4.0–10.5)

## 2013-02-21 LAB — BASIC METABOLIC PANEL
Calcium: 10.5 mg/dL (ref 8.4–10.5)
Glucose, Bld: 95 mg/dL (ref 70–99)
Potassium: 4.2 mEq/L (ref 3.5–5.3)
Sodium: 139 mEq/L (ref 135–145)

## 2013-02-23 ENCOUNTER — Ambulatory Visit (INDEPENDENT_AMBULATORY_CARE_PROVIDER_SITE_OTHER): Payer: Medicare Other | Admitting: Internal Medicine

## 2013-02-23 ENCOUNTER — Encounter: Payer: Self-pay | Admitting: Internal Medicine

## 2013-02-23 VITALS — BP 132/66 | HR 92 | Temp 97.9°F | Wt 168.0 lb

## 2013-02-23 DIAGNOSIS — M810 Age-related osteoporosis without current pathological fracture: Secondary | ICD-10-CM

## 2013-02-23 DIAGNOSIS — Z789 Other specified health status: Secondary | ICD-10-CM

## 2013-02-23 DIAGNOSIS — D509 Iron deficiency anemia, unspecified: Secondary | ICD-10-CM

## 2013-02-23 DIAGNOSIS — E785 Hyperlipidemia, unspecified: Secondary | ICD-10-CM

## 2013-02-23 DIAGNOSIS — I10 Essential (primary) hypertension: Secondary | ICD-10-CM

## 2013-02-23 NOTE — Progress Notes (Signed)
  Subjective:    Patient ID: Linda Sosa, female    DOB: May 14, 1946, 67 y.o.   MRN: [id number]  HPI 67 year old white female who is basically a vegetarian. In January 2014 she was found to have a microcytic anemia. She was sent for colonoscopy as she had never had one and this was part of her anemia workup. No etiology for anemia was found on colonoscopy. She was placed on iron supplementation. She has been back for followup in February and now today in April. Hemoglobin today is 12.4 g and previously was 11.9 g in February. In January was 10.6 g. She has a history of hypertension and osteoporosis as well as hyperlipidemia. She is on Zocor. She also takes Fosamax. She's been taking ferrous sulfate twice daily. She is on Ramipril for hypertension. B-met was drawn today and is within normal limits.    Review of Systems     Objective:   Physical Exam Skin is warm and dry. Nodes none. Neck is supple without thyromegaly JVD or carotid bruits. Chest is clear to auscultation. Cardiac exam regular rate and rhythm normal S1 and S2. Extremities without edema.       Assessment & Plan:   History of iron deficiency anemia-thought to be dietary. Negative colonoscopy. She's currently on ferrous sulfate twice daily. May reduce that to once daily.  Hyperlipidemia-stable on statin medication. Lipids not checked today.  Hypertension-treated with Ramipril and basic metabolic panel is within normal limits today.  History of osteoporosis treated with Fosamax  Plan: Physical examination due January 2015. Flu vaccine due fall 2014. Reduce iron sulfate once daily.

## 2013-05-01 ENCOUNTER — Other Ambulatory Visit: Payer: Self-pay | Admitting: Internal Medicine

## 2013-05-12 ENCOUNTER — Other Ambulatory Visit: Payer: Self-pay | Admitting: Internal Medicine

## 2013-08-10 NOTE — Patient Instructions (Addendum)
Reduce iron supplement to once daily. Return January 2015. Continue same medications otherwise

## 2013-08-23 ENCOUNTER — Other Ambulatory Visit: Payer: Self-pay | Admitting: Internal Medicine

## 2013-08-24 ENCOUNTER — Other Ambulatory Visit: Payer: Self-pay | Admitting: *Deleted

## 2013-08-24 MED ORDER — RAMIPRIL 5 MG PO CAPS: 5.0000 mg | ORAL_CAPSULE | Freq: Every day | ORAL | Status: DC

## 2013-09-21 ENCOUNTER — Other Ambulatory Visit: Payer: Self-pay | Admitting: Internal Medicine

## 2013-11-05 ENCOUNTER — Other Ambulatory Visit: Payer: Self-pay | Admitting: Internal Medicine

## 2013-11-21 ENCOUNTER — Other Ambulatory Visit: Payer: Medicare Other | Admitting: Internal Medicine

## 2013-11-21 DIAGNOSIS — Z1329 Encounter for screening for other suspected endocrine disorder: Secondary | ICD-10-CM

## 2013-11-21 DIAGNOSIS — I10 Essential (primary) hypertension: Secondary | ICD-10-CM

## 2013-11-21 DIAGNOSIS — E039 Hypothyroidism, unspecified: Secondary | ICD-10-CM

## 2013-11-21 DIAGNOSIS — E785 Hyperlipidemia, unspecified: Secondary | ICD-10-CM

## 2013-11-21 DIAGNOSIS — Z79899 Other long term (current) drug therapy: Secondary | ICD-10-CM

## 2013-11-21 DIAGNOSIS — Z130 Encounter for screening for diseases of the blood and blood-forming organs and certain disorders involving the immune mechanism: Secondary | ICD-10-CM

## 2013-11-21 DIAGNOSIS — E559 Vitamin D deficiency, unspecified: Secondary | ICD-10-CM

## 2013-11-21 LAB — CBC WITH DIFFERENTIAL/PLATELET
BASOS ABS: 0 10*3/uL (ref 0.0–0.1)
Basophils Relative: 0 % (ref 0–1)
Eosinophils Absolute: 0.1 10*3/uL (ref 0.0–0.7)
Eosinophils Relative: 2 % (ref 0–5)
HCT: 40.5 % (ref 36.0–46.0)
Hemoglobin: 13.4 g/dL (ref 12.0–15.0)
LYMPHS ABS: 2.1 10*3/uL (ref 0.7–4.0)
LYMPHS PCT: 30 % (ref 12–46)
MCH: 27.7 pg (ref 26.0–34.0)
MCHC: 33.1 g/dL (ref 30.0–36.0)
MCV: 83.7 fL (ref 78.0–100.0)
Monocytes Absolute: 0.5 10*3/uL (ref 0.1–1.0)
Monocytes Relative: 7 % (ref 3–12)
NEUTROS PCT: 61 % (ref 43–77)
Neutro Abs: 4.3 10*3/uL (ref 1.7–7.7)
PLATELETS: 271 10*3/uL (ref 150–400)
RBC: 4.84 MIL/uL (ref 3.87–5.11)
RDW: 13.4 % (ref 11.5–15.5)
WBC: 7.1 10*3/uL (ref 4.0–10.5)

## 2013-11-21 LAB — COMPREHENSIVE METABOLIC PANEL
ALT: 12 U/L (ref 0–35)
AST: 16 U/L (ref 0–37)
Albumin: 4.4 g/dL (ref 3.5–5.2)
Alkaline Phosphatase: 52 U/L (ref 39–117)
BILIRUBIN TOTAL: 0.4 mg/dL (ref 0.3–1.2)
BUN: 17 mg/dL (ref 6–23)
CALCIUM: 10 mg/dL (ref 8.4–10.5)
CHLORIDE: 102 meq/L (ref 96–112)
CO2: 27 meq/L (ref 19–32)
Creat: 0.59 mg/dL (ref 0.50–1.10)
Glucose, Bld: 91 mg/dL (ref 70–99)
Potassium: 4.4 mEq/L (ref 3.5–5.3)
SODIUM: 140 meq/L (ref 135–145)
TOTAL PROTEIN: 6.7 g/dL (ref 6.0–8.3)

## 2013-11-21 LAB — LIPID PANEL
CHOLESTEROL: 173 mg/dL (ref 0–200)
HDL: 52 mg/dL (ref >39)
LDL Cholesterol: 82 mg/dL (ref 0–99)
Total CHOL/HDL Ratio: 3.3 Ratio
Triglycerides: 194 mg/dL — ABNORMAL HIGH (ref <150)
VLDL: 39 mg/dL (ref 0–40)

## 2013-11-22 LAB — TSH: TSH: 1.928 u[IU]/mL (ref 0.350–4.500)

## 2013-11-22 LAB — VITAMIN D 25 HYDROXY (VIT D DEFICIENCY, FRACTURES): Vit D, 25-Hydroxy: 72 ng/mL (ref 30–89)

## 2013-11-23 ENCOUNTER — Encounter: Payer: Medicare Other | Admitting: Internal Medicine

## 2013-12-25 ENCOUNTER — Encounter: Payer: Self-pay | Admitting: Internal Medicine

## 2013-12-25 ENCOUNTER — Ambulatory Visit (INDEPENDENT_AMBULATORY_CARE_PROVIDER_SITE_OTHER): Payer: Medicare Other | Admitting: Internal Medicine

## 2013-12-25 VITALS — BP 144/74 | HR 72 | Temp 98.0°F | Ht 61.25 in | Wt 160.0 lb

## 2013-12-25 DIAGNOSIS — E785 Hyperlipidemia, unspecified: Secondary | ICD-10-CM

## 2013-12-25 DIAGNOSIS — Z0000 Encounter for general adult medical examination without abnormal findings: Secondary | ICD-10-CM

## 2013-12-25 DIAGNOSIS — Z8639 Personal history of other endocrine, nutritional and metabolic disease: Secondary | ICD-10-CM

## 2013-12-25 DIAGNOSIS — I10 Essential (primary) hypertension: Secondary | ICD-10-CM

## 2013-12-25 DIAGNOSIS — Z862 Personal history of diseases of the blood and blood-forming organs and certain disorders involving the immune mechanism: Secondary | ICD-10-CM

## 2013-12-25 DIAGNOSIS — M810 Age-related osteoporosis without current pathological fracture: Secondary | ICD-10-CM

## 2013-12-25 LAB — POCT URINALYSIS DIPSTICK
Bilirubin, UA: NEGATIVE
Blood, UA: NEGATIVE
GLUCOSE UA: NEGATIVE
LEUKOCYTES UA: NEGATIVE
Nitrite, UA: NEGATIVE
PROTEIN UA: NEGATIVE
Spec Grav, UA: 1.015
Urobilinogen, UA: NEGATIVE
pH, UA: 7

## 2013-12-25 MED ORDER — SIMVASTATIN 10 MG PO TABS: ORAL_TABLET | ORAL | Status: DC

## 2013-12-25 MED ORDER — ALENDRONATE SODIUM 70 MG PO TABS: ORAL_TABLET | ORAL | Status: DC

## 2013-12-25 MED ORDER — RAMIPRIL 5 MG PO CAPS: 5.0000 mg | ORAL_CAPSULE | Freq: Every day | ORAL | Status: DC

## 2013-12-25 NOTE — Progress Notes (Signed)
Subjective:    Patient ID: Linda Sosa, female    DOB: 12-08-1945, 68 y.o.   MRN: [id number]  HPI  68 year old White female for health maintenance and evaluation of medical issues. Patient has history of hyperlipidemia started on Zocor in 2011, HTN, and history of vitamin D deficiency. History of osteoporosis treated with Fosamax since 2011. Developed microcytic anemia in 2014. She had no history of melena,bright red blood per rectum or epigastric pain. Essentially is a vegetarian. Occasionally takes Aleve for back pain. Anemia improved with iron supplementation.   Has never had colonoscopy until February 2014 when she was diagnosed with iron deficiency anemia. Colonoscopy done by Dr. Jarold Motto was within normal limits except for diverticulosis.   Pneumovax given in 2013. Zostavax vaccine 2012. Tetanus immunization done October 2012. Bone density study January 2013. Mammogram 2013.   Denies depression or having a fall.  No known drug allergies  Past medical history: Cholecystectomy 1997. Found to be vitamin D deficient in 2010. Normal Pap smear November 2010.   Patient is on Fosamax, Zocor, Ramipril, aspirin 81 mg daily, iron supplement once daily.  Social history: She does not smoke or consume alcohol. Completed 2 years of college. Does not work outside the home. Enjoys gardening and traveling. He is married. Husband has history of non-Hodgkin's lymphoma and is under the care of Dr. Cyndie Chime.   Family history: Father died at 75 with heart problems and history of diabetes. Mother died at age 43 of ovarian cancer. One brother died with heart problems. 2 sisters in fairly good health but one is a diabetic. One daughter in good health.    Review of Systems  Constitutional: Negative.   All other systems reviewed and are negative.       Objective:   Physical Exam  Vitals reviewed. Constitutional: She appears well-developed and well-nourished. No distress.  HENT:  Head:  Normocephalic and atraumatic.  Right Ear: External ear normal.  Left Ear: External ear normal.  Mouth/Throat: Oropharynx is clear and moist. No oropharyngeal exudate.  Eyes: Conjunctivae and EOM are normal. Pupils are equal, round, and reactive to light. Right eye exhibits no discharge. Left eye exhibits no discharge. No scleral icterus.  Neck: Neck supple. No JVD present. No thyromegaly present.  Cardiovascular: Normal rate, regular rhythm, normal heart sounds and intact distal pulses.   No murmur heard. Pulmonary/Chest: Breath sounds normal. No respiratory distress. She has no wheezes. She has no rales.  Breasts normal female  Abdominal: Soft. Bowel sounds are normal. She exhibits no distension and no mass. There is no tenderness. There is no rebound and no guarding.  Genitourinary:  Pap done 2014. Bimanual normal.  Musculoskeletal: Normal range of motion. She exhibits no edema.  Neurological: She is alert. She has normal reflexes. No cranial nerve deficit. Coordination normal.  Skin: Skin is warm and dry. No rash noted. She is not diaphoretic.  Psychiatric: She has a normal mood and affect. Her behavior is normal. Judgment and thought content normal.          Assessment & Plan:   Essential hypertension-started on Ramipril early 2014 and blood pressure has normalized.  Osteoporosis-treated with Fosamax since 2011  Hyperlipidemia-treated with statin medication  History of iron deficiency anemia-treated with iron supplement once daily due to vegetarian diet. Had normal colonoscopy 2014  History of vitamin D deficiency treated with over-the-counter supplement  Plan: Return in 6 months for office visit lipid panel liver functions and blood pressure check. No change  in medication.  Subjective:   Patient presents for Medicare Annual/Subsequent preventive examination.   Review Past Medical/Family/Social: see above   Risk Factors  Current exercise habits: Walking and  gardening Dietary issues discussed: Basically vegetarian  Cardiac risk factors: Hypertension and hyperlipidemia. Family history.  Depression Screen  (Note: if answer to either of the following is "Yes", a more complete depression screening is indicated)   Over the past two weeks, have you felt down, depressed or hopeless? No  Over the past two weeks, have you felt little interest or pleasure in doing things? No Have you lost interest or pleasure in daily life? No Do you often feel hopeless? No Do you cry easily over simple problems? No   Activities of Daily Living  In your present state of health, do you have any difficulty performing the following activities?:   Driving? No  Managing money? No  Feeding yourself? No  Getting from bed to chair? No  Climbing a flight of stairs? No  Preparing food and eating?: No  Bathing or showering? No  Getting dressed: No  Getting to the toilet? No  Using the toilet:No  Moving around from place to place: No  In the past year have you fallen or had a near fall?:No  Are you sexually active? yes Do you have more than one partner? No   Hearing Difficulties: No  Do you often ask people to speak up or repeat themselves? No  Do you experience ringing or noises in your ears? No  Do you have difficulty understanding soft or whispered voices? No  Do you feel that you have a problem with memory? No Do you often misplace items? No    Home Safety:  Do you have a smoke alarm at your residence? Yes Do you have grab bars in the bathroom? yes Do you have throw rugs in your house?no   Cognitive Testing  Alert? Yes Normal Appearance?Yes  Oriented to person? Yes Place? Yes  Time? Yes  Recall of three objects? Yes  Can perform simple calculations? Yes  Displays appropriate judgment?Yes  Can read the correct time from a watch face?Yes   List the Names of Other Physician/Practitioners you currently use:  See referral list for the physicians patient  is currently seeing.  Optometrist- [facility]   Review of Systems: See above   Objective:     General appearance: Appears stated age and mildly obese  Head: Normocephalic, without obvious abnormality, atraumatic  Eyes: conj clear, EOMi PEERLA  Ears: normal TM's and external ear canals both ears  Nose: Nares normal. Septum midline. Mucosa normal. No drainage or sinus tenderness.  Throat: lips, mucosa, and tongue normal; teeth and gums normal  Neck: no adenopathy, no carotid bruit, no JVD, supple, symmetrical, trachea midline and thyroid not enlarged, symmetric, no tenderness/mass/nodules  No CVA tenderness.  Lungs: clear to auscultation bilaterally  Breasts: normal appearance, no masses or tenderness Heart: regular rate and rhythm, S1, S2 normal, no murmur, click, rub or gallop  Abdomen: soft, non-tender; bowel sounds normal; no masses, no organomegaly  Musculoskeletal: ROM normal in all joints, no crepitus, no deformity, Normal muscle strengthen. Back  is symmetric, no curvature. Skin: Skin color, texture, turgor normal. No rashes or lesions  Lymph nodes: Cervical, supraclavicular, and axillary nodes normal.  Neurologic: CN 2 -12 Normal, Normal symmetric reflexes. Normal coordination and gait  Psych: Alert & Oriented x 3, Mood appear stable.    Assessment:    Annual wellness medicare  exam   Plan:    During the course of the visit the patient was educated and counseled about appropriate screening and preventive services including:   Annual mammogram      Patient Instructions (the written plan) was given to the patient.  Medicare Attestation  I have personally reviewed:  The patient's medical and social history  Their use of alcohol, tobacco or illicit drugs  Their current medications and supplements  The patient's functional ability including ADLs,fall risks, home safety risks, cognitive, and hearing and visual impairment  Diet and physical activities   Evidence for depression or mood disorders  The patient's weight, height, BMI, and visual acuity have been recorded in the chart. I have made referrals, counseling, and provided education to the patient based on review of the above and I have provided the patient with a written personalized care plan for preventive services.

## 2014-01-02 ENCOUNTER — Other Ambulatory Visit: Payer: Self-pay

## 2014-01-02 DIAGNOSIS — Z1231 Encounter for screening mammogram for malignant neoplasm of breast: Secondary | ICD-10-CM

## 2014-01-14 ENCOUNTER — Other Ambulatory Visit: Payer: Self-pay | Admitting: Internal Medicine

## 2014-01-23 ENCOUNTER — Other Ambulatory Visit: Payer: Self-pay

## 2014-01-23 ENCOUNTER — Ambulatory Visit
Admission: RE | Admit: 2014-01-23 | Discharge: 2014-01-23 | Disposition: A | Discharge status: Home / Self Care | Payer: Medicare Other | Source: Ambulatory Visit

## 2014-01-23 DIAGNOSIS — Z1231 Encounter for screening mammogram for malignant neoplasm of breast: Secondary | ICD-10-CM

## 2014-01-28 NOTE — Patient Instructions (Signed)
Continue same medications and return in 6 months. Recommend annual mammogram.

## 2014-05-06 ENCOUNTER — Other Ambulatory Visit: Payer: Self-pay | Admitting: Internal Medicine

## 2014-06-25 ENCOUNTER — Other Ambulatory Visit: Payer: Medicare Other | Admitting: Internal Medicine

## 2014-06-25 DIAGNOSIS — E785 Hyperlipidemia, unspecified: Secondary | ICD-10-CM

## 2014-06-25 DIAGNOSIS — Z79899 Other long term (current) drug therapy: Secondary | ICD-10-CM

## 2014-06-25 LAB — LIPID PANEL
Cholesterol: 184 mg/dL (ref 0–200)
HDL: 50 mg/dL (ref >39)
LDL Cholesterol: 94 mg/dL (ref 0–99)
TRIGLYCERIDES: 198 mg/dL — AB (ref <150)
Total CHOL/HDL Ratio: 3.7 Ratio
VLDL: 40 mg/dL (ref 0–40)

## 2014-06-25 LAB — HEPATIC FUNCTION PANEL
ALK PHOS: 45 U/L (ref 39–117)
ALT: 10 U/L (ref 0–35)
AST: 15 U/L (ref 0–37)
Albumin: 4.4 g/dL (ref 3.5–5.2)
BILIRUBIN INDIRECT: 0.3 mg/dL (ref 0.2–1.2)
Bilirubin, Direct: 0.1 mg/dL (ref 0.0–0.3)
Total Bilirubin: 0.4 mg/dL (ref 0.2–1.2)
Total Protein: 6.3 g/dL (ref 6.0–8.3)

## 2014-06-26 ENCOUNTER — Encounter: Payer: Self-pay | Admitting: Internal Medicine

## 2014-06-26 ENCOUNTER — Ambulatory Visit (INDEPENDENT_AMBULATORY_CARE_PROVIDER_SITE_OTHER): Payer: Medicare Other | Admitting: Internal Medicine

## 2014-06-26 VITALS — BP 120/70 | HR 70 | Temp 98.2°F | Ht 60.0 in | Wt 163.0 lb

## 2014-06-26 DIAGNOSIS — E785 Hyperlipidemia, unspecified: Secondary | ICD-10-CM

## 2014-06-26 DIAGNOSIS — I10 Essential (primary) hypertension: Secondary | ICD-10-CM | POA: Insufficient documentation

## 2014-06-26 NOTE — Progress Notes (Signed)
   Subjective:    Patient ID: Linda Sosa, female    DOB: 1946-03-29, 68 y.o.   MRN: [id number]  HPI  Patient comes in today to followup on hyperlipidemia. No complaints or problems. Feels well in general. Blood pressure is within normal limits. Lipid panel is about the same. There is a little variation. She has a history of hypertension treated with Ramapril.  Blood pressure is excellent today. Triglycerides are 198 and previously were 194. Total cholesterol and LDL cholesterol within normal limits. She is on statin medication.    Review of Systems     Objective:   Physical Exam Neck is supple without JVD thyromegaly or carotid bruits. Chest clear to auscultation. Cardiac exam regular rate and rhythm normal S1 and S2. Extremities without edema       Assessment & Plan:  Hyperlipidemia-stable with statin medication. Triglycerides remain elevated. Encouraged diet and exercise.  Hypertension-blood pressure excellent today with Ramapril  Plan: Continue same medications and return late  February physical examination. Influenza immunization after October 1. Patient told to call for appointment for influenza vaccine.

## 2014-10-10 ENCOUNTER — Other Ambulatory Visit: Payer: Self-pay

## 2014-10-10 MED ORDER — RAMIPRIL 5 MG PO CAPS: 5.0000 mg | ORAL_CAPSULE | Freq: Every day | ORAL | Status: DC

## 2014-10-15 ENCOUNTER — Telehealth: Payer: Self-pay

## 2014-10-15 NOTE — Telephone Encounter (Signed)
Patient received flu vaccine October 2015.

## 2014-10-30 ENCOUNTER — Other Ambulatory Visit: Payer: Self-pay | Admitting: Internal Medicine

## 2014-12-31 ENCOUNTER — Other Ambulatory Visit: Payer: Medicare Other | Admitting: Internal Medicine

## 2014-12-31 DIAGNOSIS — D649 Anemia, unspecified: Secondary | ICD-10-CM | POA: Diagnosis not present

## 2014-12-31 DIAGNOSIS — E785 Hyperlipidemia, unspecified: Secondary | ICD-10-CM

## 2014-12-31 DIAGNOSIS — E559 Vitamin D deficiency, unspecified: Secondary | ICD-10-CM | POA: Diagnosis not present

## 2014-12-31 DIAGNOSIS — Z79899 Other long term (current) drug therapy: Secondary | ICD-10-CM

## 2014-12-31 DIAGNOSIS — R5383 Other fatigue: Secondary | ICD-10-CM

## 2014-12-31 LAB — CBC WITH DIFFERENTIAL/PLATELET
BASOS ABS: 0 10*3/uL (ref 0.0–0.1)
BASOS PCT: 0 % (ref 0–1)
EOS ABS: 0.1 10*3/uL (ref 0.0–0.7)
Eosinophils Relative: 1 % (ref 0–5)
HCT: 39.6 % (ref 36.0–46.0)
HEMOGLOBIN: 13.2 g/dL (ref 12.0–15.0)
LYMPHS ABS: 1.9 10*3/uL (ref 0.7–4.0)
Lymphocytes Relative: 27 % (ref 12–46)
MCH: 28.9 pg (ref 26.0–34.0)
MCHC: 33.3 g/dL (ref 30.0–36.0)
MCV: 86.7 fL (ref 78.0–100.0)
MPV: 10.3 fL (ref 8.6–12.4)
Monocytes Absolute: 0.5 10*3/uL (ref 0.1–1.0)
Monocytes Relative: 7 % (ref 3–12)
NEUTROS ABS: 4.5 10*3/uL (ref 1.7–7.7)
Neutrophils Relative %: 65 % (ref 43–77)
Platelets: 274 10*3/uL (ref 150–400)
RBC: 4.57 MIL/uL (ref 3.87–5.11)
RDW: 13.2 % (ref 11.5–15.5)
WBC: 6.9 10*3/uL (ref 4.0–10.5)

## 2014-12-31 LAB — COMPREHENSIVE METABOLIC PANEL
ALBUMIN: 4.3 g/dL (ref 3.5–5.2)
ALT: 16 U/L (ref 0–35)
AST: 19 U/L (ref 0–37)
Alkaline Phosphatase: 53 U/L (ref 39–117)
BILIRUBIN TOTAL: 0.4 mg/dL (ref 0.2–1.2)
BUN: 15 mg/dL (ref 6–23)
CHLORIDE: 102 meq/L (ref 96–112)
CO2: 27 meq/L (ref 19–32)
CREATININE: 0.54 mg/dL (ref 0.50–1.10)
Calcium: 10 mg/dL (ref 8.4–10.5)
Glucose, Bld: 97 mg/dL (ref 70–99)
Potassium: 4.2 mEq/L (ref 3.5–5.3)
SODIUM: 141 meq/L (ref 135–145)
TOTAL PROTEIN: 6.7 g/dL (ref 6.0–8.3)

## 2014-12-31 LAB — TSH: TSH: 1.228 u[IU]/mL (ref 0.350–4.500)

## 2015-01-01 ENCOUNTER — Ambulatory Visit (INDEPENDENT_AMBULATORY_CARE_PROVIDER_SITE_OTHER): Payer: Medicare Other | Admitting: Internal Medicine

## 2015-01-01 ENCOUNTER — Encounter: Payer: Self-pay | Admitting: Internal Medicine

## 2015-01-01 VITALS — BP 130/72 | HR 85 | Temp 97.7°F | Wt 179.0 lb

## 2015-01-01 DIAGNOSIS — I10 Essential (primary) hypertension: Secondary | ICD-10-CM | POA: Diagnosis not present

## 2015-01-01 DIAGNOSIS — M810 Age-related osteoporosis without current pathological fracture: Secondary | ICD-10-CM | POA: Diagnosis not present

## 2015-01-01 DIAGNOSIS — Z0000 Encounter for general adult medical examination without abnormal findings: Secondary | ICD-10-CM

## 2015-01-01 DIAGNOSIS — E785 Hyperlipidemia, unspecified: Secondary | ICD-10-CM

## 2015-01-01 LAB — LIPID PANEL
CHOLESTEROL: 177 mg/dL (ref 0–200)
HDL: 55 mg/dL (ref >=46)
LDL CALC: 93 mg/dL (ref 0–99)
Total CHOL/HDL Ratio: 3.2 Ratio
Triglycerides: 147 mg/dL (ref <150)
VLDL: 29 mg/dL (ref 0–40)

## 2015-01-01 LAB — VITAMIN D 25 HYDROXY (VIT D DEFICIENCY, FRACTURES): VIT D 25 HYDROXY: 33 ng/mL (ref 30–100)

## 2015-01-01 NOTE — Patient Instructions (Addendum)
Diet, exercise, and weight loss . Return in  6 months. Have bone density study and annual mammogram. Continue same medications.

## 2015-01-01 NOTE — Progress Notes (Signed)
Subjective:    Patient ID: Linda Sosa, female    DOB: 20-Aug-1946, 69 y.o.   MRN: [id number]  HPI 69 year old white female in today for health maintenance exam and evaluation of medical issues.  Weight has gained 19 pounds since last CPE February 2015 at which time she weighed 160 pounds. Admits she's been eating too many sweets. She has a history of hyperlipidemia and was started on Zocor in 2011. History of hypertension. History of vitamin D deficiency and takes 1000 units vitamin D 3 daily. History of osteoporosis treated with Fosamax since 2011. Order placed for bone density study later this year. She developed a microcytic anemia in 2014. She's not anemic with recent lab work. Anemia improved with iron supplementation in 2014. She had colonoscopy February 2014 when she was diagnosed with iron deficiency anemia which was normal except for diverticulosis.  Immunizations are up-to-date with the exception of Prevnar which she will receive later this year. Has annual mammogram.  Denies depression or falling.  No known drug allergies.  Past medical history: Cholecystectomy 1997. Was found to be vitamin D deficient in 2010. Normal Pap smear 2014 and this will not be repeated.  Social history: She does not smoke or consume alcohol. Completed 2 years of college. Does not work outside the home. Enjoys gardening and traveling. She is married. Husband has history of non-Hodgkin's lymphoma and is under the care of Dr. Truett PernaSherrill.  Family history: Father died at 3461 with heart problems and history of diabetes. Mother died at 4569 of ovarian cancer. One brother died with heart problems. 2 sisters in good health but one is a diabetic. One daughter in good health.  Review of Systems  Constitutional: Negative.   All other systems reviewed and are negative.      Objective:   Physical Exam  Constitutional: She is oriented to person, place, and time. She appears well-developed and well-nourished. No  distress.  HENT:  Head: Normocephalic and atraumatic.  Right Ear: External ear normal.  Left Ear: External ear normal.  Nose: Nose normal.  Mouth/Throat: Oropharynx is clear and moist. No oropharyngeal exudate.  Eyes: Conjunctivae are normal. Pupils are equal, round, and reactive to light. Right eye exhibits no discharge. Left eye exhibits no discharge. No scleral icterus.  Neck: Neck supple. No JVD present. No thyromegaly present.  Cardiovascular: Normal rate, regular rhythm, normal heart sounds and intact distal pulses.   No murmur heard. Pulmonary/Chest: Effort normal and breath sounds normal. No respiratory distress. She has no wheezes. She has no rales. She exhibits no tenderness.  Breasts normal female  Abdominal: Soft. Bowel sounds are normal. She exhibits no distension and no mass. There is no tenderness. There is no rebound and no guarding.  Genitourinary:  Bimanual normal. Pap taken in 2014. Plan not to repeat Pap now she is over 65  Musculoskeletal: Normal range of motion. She exhibits no edema.  Lymphadenopathy:    She has no cervical adenopathy.  Neurological: She is alert and oriented to person, place, and time. She has normal reflexes. No cranial nerve deficit.  Skin: Skin is warm and dry. No rash noted. She is not diaphoretic.  Psychiatric: She has a normal mood and affect. Her behavior is normal. Judgment and thought content normal.  Vitals reviewed.         Assessment & Plan:  Hypertension-stable on current regimen  Osteoporosis-treated with Fosamax. Bone density study due-ordered  History of vitamin D deficiency-reminded about vitamin D supplementation over-the-counter  Hyperlipidemia-stable  with statin medication  Plan: Continue same medications, have bone density study and  annual mammogram, Prevnar to be given in March, return in 6 months for office visit, fasting lipid panel liver functions.  Subjective:   Patient presents for Medicare Annual/Subsequent  preventive examination.  Review Past Medical/Family/Social: See above  Risk Factors  Current exercise habits: Works in yard Dietary issues discussed: Low fat low car  Cardiac risk factors: Hyperlipidemia, Family history  Depression Screen  (Note: if answer to either of the following is "Yes", a more complete depression screening is indicated)   Over the past two weeks, have you felt down, depressed or hopeless? No  Over the past two weeks, have you felt little interest or pleasure in doing things? No Have you lost interest or pleasure in daily life? No Do you often feel hopeless? No Do you cry easily over simple problems? No   Activities of Daily Living  In your present state of health, do you have any difficulty performing the following activities?:   Driving? No  Managing money? No  Feeding yourself? No  Getting from bed to chair? No  Climbing a flight of stairs? No  Preparing food and eating?: No  Bathing or showering? No  Getting dressed: No  Getting to the toilet? No  Using the toilet:No  Moving around from place to place: No  In the past year have you fallen or had a near fall?:No  Are you sexually active? yes Do you have more than one partner? No   Hearing Difficulties: No  Do you often ask people to speak up or repeat themselves? No  Do you experience ringing or noises in your ears? No  Do you have difficulty understanding soft or whispered voices? No  Do you feel that you have a problem with memory? No Do you often misplace items? No    Home Safety:  Do you have a smoke alarm at your residence? Yes Do you have grab bars in the bathroom? Yes Do you have throw rugs in your house? No   Cognitive Testing  Alert? Yes Normal Appearance?Yes  Oriented to person? Yes Place? Yes  Time? Yes  Recall of three objects? Yes  Can perform simple calculations? Yes  Displays appropriate judgment?Yes  Can read the correct time from a watch face?Yes   List the Names  of Other Physician/Practitioners you currently use:  See referral list for the physicians patient is currently seeing.  None   Review of Systems: See above   Objective:     General appearance: Appears stated age and mildly obese  Head: Normocephalic, without obvious abnormality, atraumatic  Eyes: conj clear, EOMi PEERLA  Ears: normal TM's and external ear canals both ears  Nose: Nares normal. Septum midline. Mucosa normal. No drainage or sinus tenderness.  Throat: lips, mucosa, and tongue normal; teeth and gums normal  Neck: no adenopathy, no carotid bruit, no JVD, supple, symmetrical, trachea midline and thyroid not enlarged, symmetric, no tenderness/mass/nodules  No CVA tenderness.  Lungs: clear to auscultation bilaterally  Breasts: normal appearance, no masses or tenderness Heart: regular rate and rhythm, S1, S2 normal, no murmur, click, rub or gallop  Abdomen: soft, non-tender; bowel sounds normal; no masses, no organomegaly  Musculoskeletal: ROM normal in all joints, no crepitus, no deformity, Normal muscle strengthen. Back  is symmetric, no curvature. Skin: Skin color, texture, turgor normal. No rashes or lesions  Lymph nodes: Cervical, supraclavicular, and axillary nodes normal.  Neurologic: CN 2 -12 Normal,  Normal symmetric reflexes. Normal coordination and gait  Psych: Alert & Oriented x 3, Mood appear stable.    Assessment:    Annual wellness medicare exam   Plan:    During the course of the visit the patient was educated and counseled about appropriate screening and preventive services including:   Annual mammogram  Bone density study     Patient Instructions (the written plan) was given to the patient.  Medicare Attestation  I have personally reviewed:  The patient's medical and social history  Their use of alcohol, tobacco or illicit drugs  Their current medications and supplements  The patient's functional ability including ADLs,fall risks, home safety  risks, cognitive, and hearing and visual impairment  Diet and physical activities  Evidence for depression or mood disorders  The patient's weight, height, BMI, and visual acuity have been recorded in the chart. I have made referrals, counseling, and provided education to the patient based on review of the above and I have provided the patient with a written personalized care plan for preventive services.

## 2015-01-18 ENCOUNTER — Ambulatory Visit (INDEPENDENT_AMBULATORY_CARE_PROVIDER_SITE_OTHER): Payer: Medicare Other | Admitting: Internal Medicine

## 2015-01-18 VITALS — BP 188/64 | HR 70 | Temp 97.9°F

## 2015-01-18 DIAGNOSIS — Z23 Encounter for immunization: Secondary | ICD-10-CM

## 2015-01-18 NOTE — Progress Notes (Signed)
Patient presents today for Prevnar vaccine. Patient VS stable. Patient tolerated injection well. 

## 2015-02-04 ENCOUNTER — Other Ambulatory Visit: Payer: Self-pay

## 2015-02-04 DIAGNOSIS — Z1231 Encounter for screening mammogram for malignant neoplasm of breast: Secondary | ICD-10-CM

## 2015-02-11 ENCOUNTER — Other Ambulatory Visit: Payer: Self-pay | Admitting: Internal Medicine

## 2015-02-21 ENCOUNTER — Encounter (INDEPENDENT_AMBULATORY_CARE_PROVIDER_SITE_OTHER): Payer: Self-pay

## 2015-02-21 ENCOUNTER — Ambulatory Visit
Admission: RE | Admit: 2015-02-21 | Discharge: 2015-02-21 | Disposition: A | Discharge status: Home / Self Care | Payer: Medicare Other | Source: Ambulatory Visit | Attending: Internal Medicine | Admitting: Internal Medicine

## 2015-02-21 ENCOUNTER — Ambulatory Visit
Admission: RE | Admit: 2015-02-21 | Discharge: 2015-02-21 | Disposition: A | Discharge status: Home / Self Care | Payer: Medicare Other | Source: Ambulatory Visit

## 2015-02-21 DIAGNOSIS — Z1231 Encounter for screening mammogram for malignant neoplasm of breast: Secondary | ICD-10-CM

## 2015-02-21 DIAGNOSIS — M810 Age-related osteoporosis without current pathological fracture: Secondary | ICD-10-CM | POA: Diagnosis not present

## 2015-02-27 ENCOUNTER — Telehealth: Payer: Self-pay | Admitting: *Deleted

## 2015-02-27 NOTE — Telephone Encounter (Signed)
Reviewed Bone Density results with patient. Given instructions to continue Fosamax

## 2015-03-29 ENCOUNTER — Encounter: Payer: Self-pay | Admitting: Internal Medicine

## 2015-05-06 ENCOUNTER — Other Ambulatory Visit: Payer: Self-pay | Admitting: Internal Medicine

## 2015-06-20 DIAGNOSIS — H52223 Regular astigmatism, bilateral: Secondary | ICD-10-CM | POA: Diagnosis not present

## 2015-07-04 ENCOUNTER — Other Ambulatory Visit: Payer: Medicare Other | Admitting: Internal Medicine

## 2015-07-04 DIAGNOSIS — E785 Hyperlipidemia, unspecified: Secondary | ICD-10-CM

## 2015-07-04 DIAGNOSIS — Z79899 Other long term (current) drug therapy: Secondary | ICD-10-CM | POA: Diagnosis not present

## 2015-07-04 LAB — HEPATIC FUNCTION PANEL
ALT: 11 U/L (ref 6–29)
AST: 17 U/L (ref 10–35)
Albumin: 4.4 g/dL (ref 3.6–5.1)
Alkaline Phosphatase: 48 U/L (ref 33–130)
Bilirubin, Direct: 0.1 mg/dL (ref <=0.2)
Indirect Bilirubin: 0.4 mg/dL (ref 0.2–1.2)
Total Bilirubin: 0.5 mg/dL (ref 0.2–1.2)
Total Protein: 6.5 g/dL (ref 6.1–8.1)

## 2015-07-04 LAB — LIPID PANEL
Cholesterol: 171 mg/dL (ref 125–200)
HDL: 53 mg/dL (ref >=46)
LDL CALC: 90 mg/dL (ref <130)
TRIGLYCERIDES: 142 mg/dL (ref <150)
Total CHOL/HDL Ratio: 3.2 Ratio (ref <=5.0)
VLDL: 28 mg/dL (ref <30)

## 2015-07-05 ENCOUNTER — Ambulatory Visit: Payer: Medicare Other | Admitting: Internal Medicine

## 2015-07-18 ENCOUNTER — Encounter: Payer: Self-pay | Admitting: Internal Medicine

## 2015-07-18 ENCOUNTER — Ambulatory Visit (INDEPENDENT_AMBULATORY_CARE_PROVIDER_SITE_OTHER): Payer: Medicare Other | Admitting: Internal Medicine

## 2015-07-18 VITALS — BP 132/78 | HR 81 | Temp 98.0°F | Ht 60.0 in | Wt 168.5 lb

## 2015-07-18 DIAGNOSIS — I10 Essential (primary) hypertension: Secondary | ICD-10-CM

## 2015-07-18 DIAGNOSIS — M810 Age-related osteoporosis without current pathological fracture: Secondary | ICD-10-CM | POA: Diagnosis not present

## 2015-07-18 DIAGNOSIS — E785 Hyperlipidemia, unspecified: Secondary | ICD-10-CM

## 2015-07-18 DIAGNOSIS — Z23 Encounter for immunization: Secondary | ICD-10-CM | POA: Diagnosis not present

## 2015-07-18 NOTE — Progress Notes (Signed)
   Subjective:    Patient ID: Linda Sosa, female    DOB: 1946/06/26, 69 y.o.   MRN: [id number]  HPI In today for six-month recheck on hyperlipidemia and essential hypertension. Blood pressure is stable at 132/78 on current regimen. History of osteoporosis and vitamin D deficiency. No new complaints or problems. Feels well. She is on Fosamax. She also takes Zocor and Ramipril.    Review of Systems     Objective:   Physical Exam Skin warm and dry. Nodes none. Chest is clear to auscultation without rales or wheezing. Cardiac exam regular rate and rhythm normal S1 and S2. Extremities without edema. Neck is supple without JVD thyromegaly or carotid bruits.       Assessment & Plan:  Essential hypertension-stable on Ramipril  Hyperlipidemia-lipid panel liver functions are normal on simvastatin  Osteoporosis-treated with Fosamax  History of vitamin D deficiency-treated with over-the-counter vitamin D3.  Plan: Flu vaccine given. Return in 6 months for physical examination. Mammogram done April 2016.

## 2015-07-18 NOTE — Patient Instructions (Signed)
It was a pleasure to see you today.  Continue same medications and return in 6 months.  Flu vaccine given. 

## 2015-08-06 DIAGNOSIS — H2511 Age-related nuclear cataract, right eye: Secondary | ICD-10-CM | POA: Diagnosis not present

## 2015-08-06 DIAGNOSIS — H25013 Cortical age-related cataract, bilateral: Secondary | ICD-10-CM | POA: Diagnosis not present

## 2015-08-06 DIAGNOSIS — H2513 Age-related nuclear cataract, bilateral: Secondary | ICD-10-CM | POA: Diagnosis not present

## 2015-08-13 ENCOUNTER — Telehealth: Payer: Self-pay | Admitting: Internal Medicine

## 2015-08-13 NOTE — Telephone Encounter (Signed)
Telephone call from Dr. Sherian Rein regarding patient needing to have tooth extraction on bisphosphonate therapy. Evidently patient has Route problem. She feels tooth needs to be extracted. She's concerned about osteonecrosis of the jaw. Told her I felt that if she was convinced if need to be extracted we should proceed without delay.

## 2015-08-14 ENCOUNTER — Telehealth: Payer: Self-pay | Admitting: Internal Medicine

## 2015-08-14 NOTE — Telephone Encounter (Signed)
She has been on Fosamax as 2011. She has osteoporosis by bone density study done earlier this year. She had decayed tooth she says that was extracted yesterday and she is doing well. Had taken Fosamax on Monday, October 3. Have advised her not to take the Fosamax for 8 weeks just as a precaution osteonecrosis. She does not have cancer and has not been treated with steroids.  Needs to have physical exam early March 2017. Patient reminded.

## 2015-10-07 DIAGNOSIS — H2511 Age-related nuclear cataract, right eye: Secondary | ICD-10-CM | POA: Diagnosis not present

## 2015-10-07 DIAGNOSIS — H25811 Combined forms of age-related cataract, right eye: Secondary | ICD-10-CM | POA: Diagnosis not present

## 2015-10-08 DIAGNOSIS — H2512 Age-related nuclear cataract, left eye: Secondary | ICD-10-CM | POA: Diagnosis not present

## 2015-10-14 DIAGNOSIS — H2513 Age-related nuclear cataract, bilateral: Secondary | ICD-10-CM | POA: Diagnosis not present

## 2015-10-17 ENCOUNTER — Other Ambulatory Visit: Payer: Self-pay | Admitting: Internal Medicine

## 2015-10-23 ENCOUNTER — Other Ambulatory Visit: Payer: Self-pay

## 2015-10-23 MED ORDER — SIMVASTATIN 10 MG PO TABS: 10.0000 mg | ORAL_TABLET | Freq: Every day | ORAL | Status: DC

## 2015-10-24 ENCOUNTER — Other Ambulatory Visit: Payer: Self-pay | Admitting: Internal Medicine

## 2015-10-25 DIAGNOSIS — H2512 Age-related nuclear cataract, left eye: Secondary | ICD-10-CM | POA: Diagnosis not present

## 2015-10-25 DIAGNOSIS — H25812 Combined forms of age-related cataract, left eye: Secondary | ICD-10-CM | POA: Diagnosis not present

## 2015-10-31 ENCOUNTER — Other Ambulatory Visit: Payer: Self-pay | Admitting: Internal Medicine

## 2015-10-31 DIAGNOSIS — H2513 Age-related nuclear cataract, bilateral: Secondary | ICD-10-CM | POA: Diagnosis not present

## 2015-11-04 ENCOUNTER — Other Ambulatory Visit: Payer: Self-pay | Admitting: Internal Medicine

## 2015-11-28 ENCOUNTER — Other Ambulatory Visit: Payer: Self-pay | Admitting: Internal Medicine

## 2016-01-02 ENCOUNTER — Other Ambulatory Visit: Payer: Medicare Other | Admitting: Internal Medicine

## 2016-01-02 DIAGNOSIS — Z0000 Encounter for general adult medical examination without abnormal findings: Secondary | ICD-10-CM | POA: Diagnosis not present

## 2016-01-02 DIAGNOSIS — M810 Age-related osteoporosis without current pathological fracture: Secondary | ICD-10-CM

## 2016-01-02 DIAGNOSIS — Z1329 Encounter for screening for other suspected endocrine disorder: Secondary | ICD-10-CM

## 2016-01-02 DIAGNOSIS — Z79899 Other long term (current) drug therapy: Secondary | ICD-10-CM | POA: Diagnosis not present

## 2016-01-02 DIAGNOSIS — D509 Iron deficiency anemia, unspecified: Secondary | ICD-10-CM

## 2016-01-02 DIAGNOSIS — I10 Essential (primary) hypertension: Secondary | ICD-10-CM | POA: Diagnosis not present

## 2016-01-02 DIAGNOSIS — E785 Hyperlipidemia, unspecified: Secondary | ICD-10-CM

## 2016-01-02 LAB — COMPLETE METABOLIC PANEL WITH GFR
ALBUMIN: 4.3 g/dL (ref 3.6–5.1)
ALK PHOS: 55 U/L (ref 33–130)
ALT: 13 U/L (ref 6–29)
AST: 17 U/L (ref 10–35)
BILIRUBIN TOTAL: 0.4 mg/dL (ref 0.2–1.2)
BUN: 15 mg/dL (ref 7–25)
CALCIUM: 9.3 mg/dL (ref 8.6–10.4)
CO2: 27 mmol/L (ref 20–31)
CREATININE: 0.56 mg/dL — AB (ref 0.60–0.93)
Chloride: 102 mmol/L (ref 98–110)
GFR, Est African American: >89 mL/min (ref >=60)
GFR, Est Non African American: >89 mL/min (ref >=60)
GLUCOSE: 90 mg/dL (ref 65–99)
Potassium: 4.2 mmol/L (ref 3.5–5.3)
Sodium: 140 mmol/L (ref 135–146)
TOTAL PROTEIN: 6.7 g/dL (ref 6.1–8.1)

## 2016-01-02 LAB — CBC WITH DIFFERENTIAL/PLATELET
BASOS PCT: 0 % (ref 0–1)
Basophils Absolute: 0 10*3/uL (ref 0.0–0.1)
Eosinophils Absolute: 0.1 10*3/uL (ref 0.0–0.7)
Eosinophils Relative: 1 % (ref 0–5)
HEMATOCRIT: 41.1 % (ref 36.0–46.0)
HEMOGLOBIN: 13.6 g/dL (ref 12.0–15.0)
LYMPHS PCT: 28 % (ref 12–46)
Lymphs Abs: 1.8 10*3/uL (ref 0.7–4.0)
MCH: 28.5 pg (ref 26.0–34.0)
MCHC: 33.1 g/dL (ref 30.0–36.0)
MCV: 86.2 fL (ref 78.0–100.0)
MONOS PCT: 7 % (ref 3–12)
MPV: 10.3 fL (ref 8.6–12.4)
Monocytes Absolute: 0.4 10*3/uL (ref 0.1–1.0)
NEUTROS ABS: 4.1 10*3/uL (ref 1.7–7.7)
NEUTROS PCT: 64 % (ref 43–77)
Platelets: 260 10*3/uL (ref 150–400)
RBC: 4.77 MIL/uL (ref 3.87–5.11)
RDW: 13.1 % (ref 11.5–15.5)
WBC: 6.4 10*3/uL (ref 4.0–10.5)

## 2016-01-02 LAB — LIPID PANEL
CHOL/HDL RATIO: 3.7 ratio (ref <=5.0)
Cholesterol: 182 mg/dL (ref 125–200)
HDL: 49 mg/dL (ref >=46)
LDL Cholesterol: 98 mg/dL (ref <130)
Triglycerides: 177 mg/dL — ABNORMAL HIGH (ref <150)
VLDL: 35 mg/dL — ABNORMAL HIGH (ref <30)

## 2016-01-02 LAB — TSH: TSH: 1.37 m[IU]/L

## 2016-01-03 LAB — VITAMIN D 25 HYDROXY (VIT D DEFICIENCY, FRACTURES): Vit D, 25-Hydroxy: 30 ng/mL (ref 30–100)

## 2016-01-06 ENCOUNTER — Ambulatory Visit (INDEPENDENT_AMBULATORY_CARE_PROVIDER_SITE_OTHER): Payer: Medicare Other | Admitting: Internal Medicine

## 2016-01-06 ENCOUNTER — Encounter: Payer: Self-pay | Admitting: Internal Medicine

## 2016-01-06 VITALS — BP 144/78 | HR 83 | Temp 97.9°F | Resp 20 | Ht 60.0 in | Wt 169.0 lb

## 2016-01-06 DIAGNOSIS — Z0000 Encounter for general adult medical examination without abnormal findings: Secondary | ICD-10-CM

## 2016-01-06 DIAGNOSIS — E785 Hyperlipidemia, unspecified: Secondary | ICD-10-CM

## 2016-01-06 DIAGNOSIS — E669 Obesity, unspecified: Secondary | ICD-10-CM

## 2016-01-06 DIAGNOSIS — I10 Essential (primary) hypertension: Secondary | ICD-10-CM | POA: Diagnosis not present

## 2016-01-06 DIAGNOSIS — M810 Age-related osteoporosis without current pathological fracture: Secondary | ICD-10-CM | POA: Diagnosis not present

## 2016-01-06 NOTE — Progress Notes (Signed)
Subjective:    Patient ID: Linda Sosa, female    DOB: 1945-11-17, 70 y.o.   MRN: [id number]  HPI 31 year White Female for health maintenance and evaluation of medical issues. Hx of osteoporosis treated with Fosamax. No trouble with dental extraction a few months ago. Bone density study April 2016 showed improvement from prior study with regard to bone density although has osteoporosis in femur and LS spine. No new complaints.    She has a history of hyperlipidemia started on Zocor in 2011. History of hypertension and history of vitamin D deficiency. Has been on Fosamax since 2011.  She developed microcytic anemia in 2014. Anemia improved with iron supplementation and she had colonoscopy February 2014 which was her first one. Colonoscopy was done by Dr. Jarold Motto and was normal except for diverticulosis. She's had no further recurrence of anemia.  Immunizations are up-to-date.  Order placed for mammogram in April. We'll repeat bone density study next year. Last one was done 2016.  No known drug allergies.  Denies depression. Has not fallen. Is now wearing a fit bit.  Past medical history: Cholecystectomy 1997. Was found to be vitamin D deficient in 2010. Last Pap smear 2014 was normal.  Social history: Does not smoke or consume alcohol. Enjoys gardening and traveling. She is married. Husband has a history of non-Hodgkin's lymphoma and is under care of oncologist at cancer Center. Does not work outside the home. Completed 2 years of college.  Family history: Father died at age 16 with heart problems with history of diabetes. Mother died at age 40 of ovarian cancer. One brother died with heart problems. 2 sisters in fairly good health but one is a diabetic. One daughter in good health.             Review of Systems  Constitutional: Negative.   All other systems reviewed and are negative.      Objective:   Physical Exam  Constitutional: She is oriented to person, place, and  time. She appears well-developed and well-nourished. No distress.  HENT:  Head: Normocephalic and atraumatic.  Right Ear: External ear normal.  Left Ear: External ear normal.  Mouth/Throat: Oropharynx is clear and moist. No oropharyngeal exudate.  Eyes: Conjunctivae and EOM are normal. Pupils are equal, round, and reactive to light. Right eye exhibits no discharge. Left eye exhibits no discharge. No scleral icterus.  Neck: Neck supple. No JVD present. No thyromegaly present.  Cardiovascular: Normal rate, regular rhythm, normal heart sounds and intact distal pulses.   No murmur heard. Pulmonary/Chest: Effort normal and breath sounds normal. She has no wheezes. She has no rales.  Abdominal: Bowel sounds are normal. She exhibits no distension and no mass. There is no tenderness. There is no rebound and no guarding.  Genitourinary:  Pap done in 2014 which was normal and would not be repeated due to age. Bimanual normal.  Musculoskeletal: She exhibits no edema.  Lymphadenopathy:    She has no cervical adenopathy.  Neurological: She is alert and oriented to person, place, and time. She has normal reflexes. No cranial nerve deficit.  Skin: Skin is warm and dry. No rash noted. She is not diaphoretic.  Psychiatric: She has a normal mood and affect. Her behavior is normal. Judgment and thought content normal.  Vitals reviewed.         Assessment & Plan:  Osteoporosis-bone density study done in 2016 and will repeat in 2018. Continue Fosamax for now. Study in 2016 showed improvement in  bone density  from previous study. Take vitamin D 2000 units daily.  Essential hypertension-blood pressure elevated today. Call office with readings in the next 2 weeks from home. May need to change dose of antihypertensive medication  Hyperlipidemia-lipid panel liver functions normal  Plan: She comes every 6 months and we will set up appointment to be seen in every 6 months for lipid panel liver functions blood  pressure check.  Subjective:   Patient presents for Medicare Annual/Subsequent preventive examination.  Review Past Medical/Family/Social: See above   Risk Factors  Current exercise habits: Active in yard Dietary issues discussed: Low fat low carbohydrate  Cardiac risk factors: Hypertension and hyperlipidemia. Family history of heart problems in father. One brother with history of heart problems.  Depression Screen  (Note: if answer to either of the following is "Yes", a more complete depression screening is indicated)   Over the past two weeks, have you felt down, depressed or hopeless? No  Over the past two weeks, have you felt little interest or pleasure in doing things? No Have you lost interest or pleasure in daily life? No Do you often feel hopeless? No Do you cry easily over simple problems? No   Activities of Daily Living  In your present state of health, do you have any difficulty performing the following activities?:   Driving? No  Managing money? No  Feeding yourself? No  Getting from bed to chair? No  Climbing a flight of stairs? No  Preparing food and eating?: No  Bathing or showering? No  Getting dressed: No  Getting to the toilet? No  Using the toilet:No  Moving around from place to place: No  In the past year have you fallen or had a near fall?:No  Are you sexually active? yes Do you have more than one partner? No   Hearing Difficulties: No  Do you often ask people to speak up or repeat themselves? No  Do you experience ringing or noises in your ears? No  Do you have difficulty understanding soft or whispered voices? No  Do you feel that you have a problem with memory? No Do you often misplace items? No    Home Safety:  Do you have a smoke alarm at your residence? Yes Do you have grab bars in the bathroom? Yes Do you have throw rugs in your house? No   Cognitive Testing  Alert? Yes Normal Appearance?Yes  Oriented to person? Yes Place? Yes    Time? Yes  Recall of three objects? Yes  Can perform simple calculations? Yes  Displays appropriate judgment?Yes  Can read the correct time from a watch face?Yes   List the Names of Other Physician/Practitioners you currently use:  See referral list for the physicians patient is currently seeing.     Review of Systems: See above   Objective:     General appearance: Appears stated age and mildly obese  Head: Normocephalic, without obvious abnormality, atraumatic  Eyes: conj clear, EOMi PEERLA  Ears: normal TM's and external ear canals both ears  Nose: Nares normal. Septum midline. Mucosa normal. No drainage or sinus tenderness.  Throat: lips, mucosa, and tongue normal; teeth and gums normal  Neck: no adenopathy, no carotid bruit, no JVD, supple, symmetrical, trachea midline and thyroid not enlarged, symmetric, no tenderness/mass/nodules  No CVA tenderness.  Lungs: clear to auscultation bilaterally  Breasts: normal appearance, no masses or tenderness, top of the pacemaker on left upper chest. Incision well-healed. It is tender.  Heart: regular rate  and rhythm, S1, S2 normal, no murmur, click, rub or gallop  Abdomen: soft, non-tender; bowel sounds normal; no masses, no organomegaly  Musculoskeletal: ROM normal in all joints, no crepitus, no deformity, Normal muscle strengthen. Back  is symmetric, no curvature. Skin: Skin color, texture, turgor normal. No rashes or lesions  Lymph nodes: Cervical, supraclavicular, and axillary nodes normal.  Neurologic: CN 2 -12 Normal, Normal symmetric reflexes. Normal coordination and gait  Psych: Alert & Oriented x 3, Mood appear stable.    Assessment:    Annual wellness medicare exam   Plan:    During the course of the visit the patient was educated and counseled about appropriate screening and preventive services including:   Annual mammogram  Bone density study q 2 yrs     Patient Instructions (the written plan) was given to the  patient.  Medicare Attestation  I have personally reviewed:  The patient's medical and social history  Their use of alcohol, tobacco or illicit drugs  Their current medications and supplements  The patient's functional ability including ADLs,fall risks, home safety risks, cognitive, and hearing and visual impairment  Diet and physical activities  Evidence for depression or mood disorders  The patient's weight, height, BMI, and visual acuity have been recorded in the chart. I have made referrals, counseling, and provided education to the patient based on review of the above and I have provided the patient with a written personalized care plan for preventive services.

## 2016-01-06 NOTE — Patient Instructions (Signed)
Continue same medications and call with blood pressure readings in a couple of weeks. May need to increase dose of antihypertensive medication. Otherwise return in 6 months. Have mammogram in April. Bone density study to be done next year. Continue Fosamax.

## 2016-01-14 ENCOUNTER — Other Ambulatory Visit: Payer: Self-pay | Admitting: Internal Medicine

## 2016-01-14 DIAGNOSIS — Z1231 Encounter for screening mammogram for malignant neoplasm of breast: Secondary | ICD-10-CM

## 2016-01-15 ENCOUNTER — Telehealth: Payer: Self-pay

## 2016-01-15 NOTE — Telephone Encounter (Signed)
Patient states that she was instructed to call with some blood pressure readings. She states that these have all been taken at 8:00 am each day.   01/09/16: 125/73 01/10/16: 115/67 01/11/16: 120/79 01/12/16: 128/66 01/13/16: 129/70 01/14/16: 116/69 01/15/16: 126/66  Please advise.

## 2016-01-15 NOTE — Telephone Encounter (Signed)
Left message for return call.

## 2016-01-15 NOTE — Telephone Encounter (Signed)
BP stable. No change in BP meds.

## 2016-01-16 NOTE — Telephone Encounter (Signed)
Patient notified

## 2016-02-25 ENCOUNTER — Ambulatory Visit
Admission: RE | Admit: 2016-02-25 | Discharge: 2016-02-25 | Disposition: A | Discharge status: Home / Self Care | Payer: Medicare Other | Source: Ambulatory Visit | Attending: Internal Medicine | Admitting: Internal Medicine

## 2016-02-25 DIAGNOSIS — Z1231 Encounter for screening mammogram for malignant neoplasm of breast: Secondary | ICD-10-CM | POA: Diagnosis not present

## 2016-04-12 ENCOUNTER — Other Ambulatory Visit: Payer: Self-pay | Admitting: Internal Medicine

## 2016-04-26 ENCOUNTER — Other Ambulatory Visit: Payer: Self-pay | Admitting: Internal Medicine

## 2016-06-07 ENCOUNTER — Other Ambulatory Visit: Payer: Self-pay | Admitting: Internal Medicine

## 2016-07-06 ENCOUNTER — Other Ambulatory Visit: Payer: Medicare Other | Admitting: Internal Medicine

## 2016-07-06 DIAGNOSIS — I10 Essential (primary) hypertension: Secondary | ICD-10-CM

## 2016-07-06 DIAGNOSIS — E785 Hyperlipidemia, unspecified: Secondary | ICD-10-CM

## 2016-07-06 LAB — HEPATIC FUNCTION PANEL
ALT: 12 U/L (ref 6–29)
AST: 16 U/L (ref 10–35)
Albumin: 4.3 g/dL (ref 3.6–5.1)
Alkaline Phosphatase: 47 U/L (ref 33–130)
BILIRUBIN DIRECT: 0.1 mg/dL (ref <=0.2)
Indirect Bilirubin: 0.3 mg/dL (ref 0.2–1.2)
Total Bilirubin: 0.4 mg/dL (ref 0.2–1.2)
Total Protein: 6.4 g/dL (ref 6.1–8.1)

## 2016-07-06 LAB — LIPID PANEL
CHOL/HDL RATIO: 3.4 ratio (ref <=5.0)
CHOLESTEROL: 177 mg/dL (ref 125–200)
HDL: 52 mg/dL (ref >=46)
LDL Cholesterol: 93 mg/dL (ref <130)
Triglycerides: 160 mg/dL — ABNORMAL HIGH (ref <150)
VLDL: 32 mg/dL — ABNORMAL HIGH (ref <30)

## 2016-07-07 ENCOUNTER — Ambulatory Visit (INDEPENDENT_AMBULATORY_CARE_PROVIDER_SITE_OTHER): Payer: Medicare Other | Admitting: Internal Medicine

## 2016-07-07 ENCOUNTER — Encounter: Payer: Self-pay | Admitting: Internal Medicine

## 2016-07-07 VITALS — BP 130/76 | HR 95 | Temp 97.4°F | Ht 60.0 in | Wt 164.5 lb

## 2016-07-07 DIAGNOSIS — I10 Essential (primary) hypertension: Secondary | ICD-10-CM

## 2016-07-07 DIAGNOSIS — E785 Hyperlipidemia, unspecified: Secondary | ICD-10-CM

## 2016-07-07 DIAGNOSIS — M810 Age-related osteoporosis without current pathological fracture: Secondary | ICD-10-CM

## 2016-07-07 DIAGNOSIS — Z23 Encounter for immunization: Secondary | ICD-10-CM | POA: Diagnosis not present

## 2016-07-07 MED ORDER — SIMVASTATIN 10 MG PO TABS: ORAL_TABLET | ORAL | 0 refills | Status: DC

## 2016-07-07 MED ORDER — RAMIPRIL 5 MG PO CAPS: 5.0000 mg | ORAL_CAPSULE | Freq: Every day | ORAL | 0 refills | Status: DC

## 2016-07-07 NOTE — Addendum Note (Signed)
Addended by: Doree BarthelLOWE, CASANDRA on: 07/07/2016 10:48 AM   Modules accepted: Orders

## 2016-07-07 NOTE — Progress Notes (Signed)
   Subjective:    Patient ID: Linda Sosa, female    DOB: 04-07-1946, 70 y.o.   MRN: [id number]  HPI  70 year old Female for 6 month recheck. Hx osteoporosis on Fosamax since2011. Were going to stop Fosamax. She'll have repeat bone density study fall 2018. History of essential hypertension which is stable on low-dose ACE inhibitor. History of hyperlipidemia treated with statin. Triglycerides have improved with diet and exercise. No new complaints    Review of Systems see above     Objective:   Physical Exam  Neck: No JVD present. No thyromegaly present.  Cardiovascular: Normal rate, regular rhythm and normal heart sounds.   Pulmonary/Chest: No respiratory distress. She has no wheezes.  Musculoskeletal: She exhibits no edema.  Lymphadenopathy:    She has no cervical adenopathy.  Skin: Skin is warm and dry.          Assessment & Plan:  Osteoporosis-Stop Fosamax. Repeat bone density fall 2018. May be candidate for Prolia in the future.  Essential hypertension-stable. Medication refilled.  Hyperlipidemia-stable with triglycerides improved. Continue diet and exercise and return in 6 months for physical exam  Flu vaccine given. Physical exam due in 6 months.

## 2016-07-07 NOTE — Patient Instructions (Addendum)
Continue same medications except for Fosamax which will be discontinued. Bone density study to be repeated Fall 2018. Return in 6 months for physical exam. Flu vaccine given.

## 2016-07-23 ENCOUNTER — Other Ambulatory Visit: Payer: Self-pay | Admitting: Internal Medicine

## 2016-12-27 ENCOUNTER — Other Ambulatory Visit: Payer: Self-pay | Admitting: Internal Medicine

## 2017-01-04 ENCOUNTER — Other Ambulatory Visit: Payer: Medicare Other | Admitting: Internal Medicine

## 2017-01-04 DIAGNOSIS — M810 Age-related osteoporosis without current pathological fracture: Secondary | ICD-10-CM | POA: Diagnosis not present

## 2017-01-04 DIAGNOSIS — E785 Hyperlipidemia, unspecified: Secondary | ICD-10-CM

## 2017-01-04 DIAGNOSIS — Z0000 Encounter for general adult medical examination without abnormal findings: Secondary | ICD-10-CM

## 2017-01-04 DIAGNOSIS — Z1329 Encounter for screening for other suspected endocrine disorder: Secondary | ICD-10-CM | POA: Diagnosis not present

## 2017-01-04 DIAGNOSIS — I10 Essential (primary) hypertension: Secondary | ICD-10-CM | POA: Diagnosis not present

## 2017-01-04 LAB — CBC WITH DIFFERENTIAL/PLATELET
BASOS ABS: 0 {cells}/uL (ref 0–200)
BASOS PCT: 0 %
Eosinophils Absolute: 134 cells/uL (ref 15–500)
Eosinophils Relative: 2 %
HEMATOCRIT: 39.7 % (ref 35.0–45.0)
Hemoglobin: 13 g/dL (ref 11.7–15.5)
LYMPHS PCT: 29 %
Lymphs Abs: 1943 cells/uL (ref 850–3900)
MCH: 28.5 pg (ref 27.0–33.0)
MCHC: 32.7 g/dL (ref 32.0–36.0)
MCV: 87.1 fL (ref 80.0–100.0)
MONO ABS: 469 {cells}/uL (ref 200–950)
MONOS PCT: 7 %
MPV: 10.1 fL (ref 7.5–12.5)
Neutro Abs: 4154 cells/uL (ref 1500–7800)
Neutrophils Relative %: 62 %
Platelets: 267 10*3/uL (ref 140–400)
RBC: 4.56 MIL/uL (ref 3.80–5.10)
RDW: 13.2 % (ref 11.0–15.0)
WBC: 6.7 10*3/uL (ref 3.8–10.8)

## 2017-01-04 LAB — COMPREHENSIVE METABOLIC PANEL
ALK PHOS: 61 U/L (ref 33–130)
ALT: 12 U/L (ref 6–29)
AST: 17 U/L (ref 10–35)
Albumin: 4.3 g/dL (ref 3.6–5.1)
BUN: 19 mg/dL (ref 7–25)
CALCIUM: 9.6 mg/dL (ref 8.6–10.4)
CHLORIDE: 102 mmol/L (ref 98–110)
CO2: 27 mmol/L (ref 20–31)
Creat: 0.55 mg/dL — ABNORMAL LOW (ref 0.60–0.93)
GLUCOSE: 91 mg/dL (ref 65–99)
POTASSIUM: 4.1 mmol/L (ref 3.5–5.3)
Sodium: 138 mmol/L (ref 135–146)
TOTAL PROTEIN: 6.4 g/dL (ref 6.1–8.1)
Total Bilirubin: 0.5 mg/dL (ref 0.2–1.2)

## 2017-01-04 LAB — LIPID PANEL
CHOL/HDL RATIO: 3.1 ratio (ref <5.0)
CHOLESTEROL: 161 mg/dL (ref <200)
HDL: 52 mg/dL (ref >50)
LDL Cholesterol: 80 mg/dL (ref <100)
Triglycerides: 147 mg/dL (ref <150)
VLDL: 29 mg/dL (ref <30)

## 2017-01-04 LAB — TSH: TSH: 1.05 mIU/L

## 2017-01-05 LAB — VITAMIN D 25 HYDROXY (VIT D DEFICIENCY, FRACTURES): Vit D, 25-Hydroxy: 46 ng/mL (ref 30–100)

## 2017-01-07 ENCOUNTER — Ambulatory Visit (INDEPENDENT_AMBULATORY_CARE_PROVIDER_SITE_OTHER): Payer: Medicare Other | Admitting: Internal Medicine

## 2017-01-07 ENCOUNTER — Encounter: Payer: Self-pay | Admitting: Internal Medicine

## 2017-01-07 VITALS — BP 162/90 | HR 94 | Temp 97.8°F | Ht 59.5 in | Wt 166.0 lb

## 2017-01-07 DIAGNOSIS — M810 Age-related osteoporosis without current pathological fracture: Secondary | ICD-10-CM | POA: Diagnosis not present

## 2017-01-07 DIAGNOSIS — Z0000 Encounter for general adult medical examination without abnormal findings: Secondary | ICD-10-CM

## 2017-01-07 DIAGNOSIS — E784 Other hyperlipidemia: Secondary | ICD-10-CM

## 2017-01-07 DIAGNOSIS — I10 Essential (primary) hypertension: Secondary | ICD-10-CM | POA: Diagnosis not present

## 2017-01-07 DIAGNOSIS — E7849 Other hyperlipidemia: Secondary | ICD-10-CM

## 2017-01-07 DIAGNOSIS — R030 Elevated blood-pressure reading, without diagnosis of hypertension: Secondary | ICD-10-CM | POA: Diagnosis not present

## 2017-01-07 DIAGNOSIS — Z6832 Body mass index (BMI) 32.0-32.9, adult: Secondary | ICD-10-CM | POA: Diagnosis not present

## 2017-01-07 LAB — POCT URINALYSIS DIPSTICK
BILIRUBIN UA: NEGATIVE
Blood, UA: NEGATIVE
GLUCOSE UA: NEGATIVE
Ketones, UA: NEGATIVE
LEUKOCYTES UA: NEGATIVE
NITRITE UA: NEGATIVE
PH UA: 6.5
Protein, UA: NEGATIVE
Spec Grav, UA: 1.01
Urobilinogen, UA: NEGATIVE

## 2017-01-07 NOTE — Progress Notes (Signed)
Subjective:    Patient ID: Linda Sosa, female    Lilia ProB: 07/03/1946, 71 y.o.   MRN: [id number]  HPI  71 year old Female for health maintenance exam and evaluation of medical issues.  History of osteoporosis treated with Fosamax. History of hyperlipidemia started on Zocor in 2011. History of hypertension and vitamin D deficiency.  She developed microcytic anemia in 2014. Anemia improved with iron supplementation and she had colonoscopy February 2014 which was her first colonoscopy. It was done by Dr. Jarold MottoPatterson and was normal except for diverticulosis. She's had no further recurrence of anemia.  Past medical history: Cholecystectomy 1997. Was found to be vitamin D deficient in 2010. Last Pap smear 2014 was normal.  Social history: Does not smoke or consume alcohol. Enjoys gardening and traveling. She is married. Husband has history of non-Hodgkin's lymphoma and is cared for by oncologist at cancer [facility]. Patient does not work outside the home. Completed 2 years of college.  Family history: Father died at age 72 with heart problems with history of diabetes. Mother died at age 72 of ovarian cancer. One brother died with heart problems. 2 sisters in fairly good health but one is a diabetic. One daughter in good health.    Review of Systems  Constitutional: Negative.   Respiratory: Negative.   Cardiovascular: Negative.   Gastrointestinal: Negative.   Genitourinary: Negative.   Musculoskeletal: Positive for back pain.  Neurological: Negative.   Psychiatric/Behavioral: Negative.        Objective:   Physical Exam  Constitutional: She is oriented to person, place, and time. She appears well-developed and well-nourished. No distress.  HENT:  Head: Normocephalic and atraumatic.  Right Ear: External ear normal.  Left Ear: External ear normal.  Mouth/Throat: Oropharynx is clear and moist.  Eyes: Conjunctivae and EOM are normal. Pupils are equal, round, and reactive to light. Right eye  exhibits no discharge. Left eye exhibits no discharge. No scleral icterus.  Neck: Neck supple. No JVD present.  Cardiovascular: Normal rate, regular rhythm, normal heart sounds and intact distal pulses.   No murmur heard. Pulmonary/Chest: No respiratory distress. She has no wheezes. She has no rales.  Breasts normal female  Abdominal: Soft. Bowel sounds are normal. She exhibits no distension and no mass. There is no tenderness. There is no rebound and no guarding.  Genitourinary:  Genitourinary Comments: Bimanual exam normal  Musculoskeletal: She exhibits no edema.  Lymphadenopathy:    She has no cervical adenopathy.  Neurological: She is alert and oriented to person, place, and time. She has normal reflexes. No cranial nerve deficit. Coordination normal.  Skin: Skin is warm and dry. No rash noted. She is not diaphoretic.  Psychiatric: She has a normal mood and affect. Her behavior is normal. Judgment and thought content normal.  Vitals reviewed.         Assessment & Plan:  Elevated blood pressure reading- patient to check blood pressure readings at home and call with progress report in a couple of weeks. Currently Ramipril 5 mg daily  Had similar episode last year and subsequent blood pressure readings at home were reasonable  Osteoporosis-bone density study done in 2016. Fosamax was stopped in August 2017. Is to have repeat bone density study Fall 2018.  Hyperlipidemia-treated with statin.  Plan: Return in 6 months. Call with blood pressure readings in 2 weeks.  Addendum: Blood pressure readings reviewed with patient by phone 01/20/2017 are acceptable. Systolics ranging in the 120s to low 130s and diastolics ranging in  the 70s and low 80s.  Subjective:   Patient presents for Medicare Annual/Subsequent preventive examination.  Review Past Medical/Family/Social:See above   Risk Factors  Current exercise habits: yard work Dietary issues discussed: Low fat low  carbohydrate  Cardiac risk factors: Hyperlipidemia, family history of heart problems in father. One brother with history of heart problems.  Depression Screen  (Note: if answer to either of the following is "Yes", a more complete depression screening is indicated)   Over the past two weeks, have you felt down, depressed or hopeless? No  Over the past two weeks, have you felt little interest or pleasure in doing things? No Have you lost interest or pleasure in daily life? No Do you often feel hopeless? No Do you cry easily over simple problems? No   Activities of Daily Living  In your present state of health, do you have any difficulty performing the following activities?:   Driving? No  Managing money? No  Feeding yourself? No  Getting from bed to chair? No  Climbing a flight of stairs? No  Preparing food and eating?: No  Bathing or showering? No  Getting dressed: No  Getting to the toilet? No  Using the toilet:No  Moving around from place to place: No  In the past year have you fallen or had a near fall?:No  Are you sexually active? No  Do you have more than one partner? No   Hearing Difficulties: No  Do you often ask people to speak up or repeat themselves? No  Do you experience ringing or noises in your ears? No  Do you have difficulty understanding soft or whispered voices? No  Do you feel that you have a problem with memory? No Do you often misplace items? No    Home Safety:  Do you have a smoke alarm at your residence? Yes Do you have grab bars in the bathroom?yes Do you have throw rugs in your house? no   Cognitive Testing  Alert? Yes Normal Appearance?Yes  Oriented to person? Yes Place? Yes  Time? Yes  Recall of three objects? Yes  Can perform simple calculations? Yes  Displays appropriate judgment?Yes  Can read the correct time from a watch face?Yes   List the Names of Other Physician/Practitioners you currently use:  See referral list for the  physicians patient is currently seeing.     Review of Systems: See above.   Objective:     General appearance: Appears stated age and mildly obese  Head: Normocephalic, without obvious abnormality, atraumatic  Eyes: conj clear, EOMi PEERLA  Ears: normal TM's and external ear canals both ears  Nose: Nares normal. Septum midline. Mucosa normal. No drainage or sinus tenderness.  Throat: lips, mucosa, and tongue normal; teeth and gums normal  Neck: no adenopathy, no carotid bruit, no JVD, supple, symmetrical, trachea midline and thyroid not enlarged, symmetric, no tenderness/mass/nodules  No CVA tenderness.  Lungs: clear to auscultation bilaterally  Breasts: normal appearance, no masses or tenderness, top of the pacemaker on left upper chest. Incision well-healed. It is tender.  Heart: regular rate and rhythm, S1, S2 normal, no murmur, click, rub or gallop  Abdomen: soft, non-tender; bowel sounds normal; no masses, no organomegaly  Musculoskeletal: ROM normal in all joints, no crepitus, no deformity, Normal muscle strengthen. Back  is symmetric, no curvature. Skin: Skin color, texture, turgor normal. No rashes or lesions  Lymph nodes: Cervical, supraclavicular, and axillary nodes normal.  Neurologic: CN 2 -12 Normal, Normal symmetric reflexes. Normal  coordination and gait  Psych: Alert & Oriented x 3, Mood appear stable.    Assessment:    Annual wellness medicare exam   Plan:    During the course of the visit the patient was educated and counseled about appropriate screening and preventive services including:  Annual mammogram  Bone density study to be done Fall of 2018      Patient Instructions (the written plan) was given to the patient.  Medicare Attestation  I have personally reviewed:  The patient's medical and social history  Their use of alcohol, tobacco or illicit drugs  Their current medications and supplements  The patient's functional ability including  ADLs,fall risks, home safety risks, cognitive, and hearing and visual impairment  Diet and physical activities  Evidence for depression or mood disorders  The patient's weight, height, BMI, and visual acuity have been recorded in the chart. I have made referrals, counseling, and provided education to the patient based on review of the above and I have provided the patient with a written personalized care plan for preventive services.

## 2017-01-20 NOTE — Patient Instructions (Addendum)
Continue diet and exercise efforts. Return in 6 months. Call with blood pressure readings in 2 weeks. Monitor blood pressure at home. Have bone density study Fall of 2018. Continue same medications

## 2017-02-18 ENCOUNTER — Other Ambulatory Visit: Payer: Self-pay | Admitting: Internal Medicine

## 2017-02-18 DIAGNOSIS — Z1231 Encounter for screening mammogram for malignant neoplasm of breast: Secondary | ICD-10-CM

## 2017-03-23 ENCOUNTER — Other Ambulatory Visit: Payer: Medicare Other

## 2017-03-23 ENCOUNTER — Ambulatory Visit
Admission: RE | Admit: 2017-03-23 | Discharge: 2017-03-23 | Disposition: A | Discharge status: Home / Self Care | Payer: Medicare Other | Source: Ambulatory Visit | Attending: Internal Medicine | Admitting: Internal Medicine

## 2017-03-23 DIAGNOSIS — Z1231 Encounter for screening mammogram for malignant neoplasm of breast: Secondary | ICD-10-CM

## 2017-04-01 ENCOUNTER — Other Ambulatory Visit: Payer: Self-pay | Admitting: Internal Medicine

## 2017-04-24 ENCOUNTER — Other Ambulatory Visit: Payer: Self-pay | Admitting: Internal Medicine

## 2017-06-29 ENCOUNTER — Ambulatory Visit
Admission: RE | Admit: 2017-06-29 | Discharge: 2017-06-29 | Disposition: A | Discharge status: Home / Self Care | Payer: Medicare Other | Source: Ambulatory Visit | Attending: Internal Medicine | Admitting: Internal Medicine

## 2017-06-29 DIAGNOSIS — M810 Age-related osteoporosis without current pathological fracture: Secondary | ICD-10-CM | POA: Diagnosis not present

## 2017-06-29 DIAGNOSIS — Z780 Asymptomatic menopausal state: Secondary | ICD-10-CM | POA: Diagnosis not present

## 2017-07-13 ENCOUNTER — Other Ambulatory Visit: Payer: Medicare Other | Admitting: Internal Medicine

## 2017-07-13 DIAGNOSIS — I10 Essential (primary) hypertension: Secondary | ICD-10-CM | POA: Diagnosis not present

## 2017-07-13 DIAGNOSIS — E785 Hyperlipidemia, unspecified: Secondary | ICD-10-CM

## 2017-07-14 LAB — HEPATIC FUNCTION PANEL
ALBUMIN: 4.2 g/dL (ref 3.6–5.1)
ALK PHOS: 62 U/L (ref 33–130)
ALT: 12 U/L (ref 6–29)
AST: 16 U/L (ref 10–35)
BILIRUBIN INDIRECT: 0.3 mg/dL (ref 0.2–1.2)
BILIRUBIN TOTAL: 0.4 mg/dL (ref 0.2–1.2)
Bilirubin, Direct: 0.1 mg/dL (ref <=0.2)
Total Protein: 6.3 g/dL (ref 6.1–8.1)

## 2017-07-14 LAB — LIPID PANEL
Cholesterol: 172 mg/dL (ref <200)
HDL: 53 mg/dL (ref >50)
LDL Cholesterol: 85 mg/dL (ref <100)
TRIGLYCERIDES: 169 mg/dL — AB (ref <150)
Total CHOL/HDL Ratio: 3.2 Ratio (ref <5.0)
VLDL: 34 mg/dL — ABNORMAL HIGH (ref <30)

## 2017-07-15 ENCOUNTER — Encounter: Payer: Self-pay | Admitting: Internal Medicine

## 2017-07-15 ENCOUNTER — Ambulatory Visit (INDEPENDENT_AMBULATORY_CARE_PROVIDER_SITE_OTHER): Payer: Medicare Other | Admitting: Internal Medicine

## 2017-07-15 VITALS — BP 142/60 | HR 83 | Temp 97.6°F | Wt 165.0 lb

## 2017-07-15 DIAGNOSIS — M810 Age-related osteoporosis without current pathological fracture: Secondary | ICD-10-CM | POA: Diagnosis not present

## 2017-07-15 DIAGNOSIS — Z23 Encounter for immunization: Secondary | ICD-10-CM | POA: Diagnosis not present

## 2017-07-15 DIAGNOSIS — I10 Essential (primary) hypertension: Secondary | ICD-10-CM

## 2017-07-15 DIAGNOSIS — E784 Other hyperlipidemia: Secondary | ICD-10-CM

## 2017-07-15 DIAGNOSIS — E7849 Other hyperlipidemia: Secondary | ICD-10-CM

## 2017-07-15 DIAGNOSIS — R030 Elevated blood-pressure reading, without diagnosis of hypertension: Secondary | ICD-10-CM | POA: Diagnosis not present

## 2017-07-15 MED ORDER — LOSARTAN POTASSIUM 50 MG PO TABS: 50.0000 mg | ORAL_TABLET | Freq: Every day | ORAL | 0 refills | Status: DC

## 2017-07-15 NOTE — Patient Instructions (Signed)
Flu vaccine given. Follow-up in 2 weeks on new blood pressure medication. See endocrinologist regarding osteoporosis.

## 2017-07-15 NOTE — Progress Notes (Signed)
   Subjective:    Patient ID: Linda Sosa, female    DOB: [birth year], 71 y.o.   MRN: [id number]  HPI   71 year old Female for 6 month recheck. No new complaints. Feels well.   Hx hyperlipidemia. She remains on simvastatin 10 mg daily. Triglycerides are elevated at 169 and 6 months ago were 147. Total cholesterol is 172 and LDL cholesterol is 85. HDL cholesterol was 53.  She takes Ramapril 5 mg daily for hypertension. Blood pressure is elevated today. Regardless switch to losartan 50 mg daily and have her follow-up in 2 weeks. She does have some cough that she attributes to allergies. We'll see if this improves off [place] real.  With regard to health maintenance, flu vaccine given today  She has a history of osteoporosis and took Fosamax for many years. She had a bone density study recently with T score in the spine of -3.3 and T score in the right femoral neck of -2.8. Last bone density study will prior to that was in 2016 and T score in LS spine was -3.2 and in the left femoral neck -2.6. All in all the seems about the same to me.  She is active around her house and does a lot of gardening. She's not had any recent falls.  I'm going to asked endocrinology to see her to see if she is a candidate for Prolia or Reclast.    Review of Systems see above     Objective:   Physical Exam Skin warm and dry. Nodes none. Neck is supple without thyromegaly or carotid bruits. Chest clear. Cardiac exam regular rate and rhythm. Extremities without edema       Assessment & Plan:  Elevated blood pressure-change Ramapril to losartan and follow-up in 2 weeks  Hyperlipidemia-triglycerides are a bit elevated but she's got a watch her diet and we will continue same dose of statin medication  Health maintenance-flu vaccine given  Osteoporosis-see endocrinologist  Plan: Follow-up in 2 weeks on losartan 50 mg daily. Flu vaccine given.

## 2017-07-30 ENCOUNTER — Encounter: Payer: Self-pay | Admitting: Internal Medicine

## 2017-07-30 ENCOUNTER — Ambulatory Visit (INDEPENDENT_AMBULATORY_CARE_PROVIDER_SITE_OTHER): Payer: Medicare Other | Admitting: Internal Medicine

## 2017-07-30 VITALS — BP 138/74 | HR 87 | Temp 97.7°F | Wt 167.0 lb

## 2017-07-30 DIAGNOSIS — I10 Essential (primary) hypertension: Secondary | ICD-10-CM | POA: Diagnosis not present

## 2017-07-30 MED ORDER — LOSARTAN POTASSIUM 100 MG PO TABS: 100.0000 mg | ORAL_TABLET | Freq: Every day | ORAL | 0 refills | Status: DC

## 2017-07-30 NOTE — Progress Notes (Signed)
   Subjective:    Patient ID: Linda Sosa, female    DOB: May 01, 1946, 71 y.o.   MRN: [id number]  HPI 71 year old Female forFollow-up of hypertension. Last visit she was put on losartan 50 mg daily. Blood pressure is still elevated here in office today at 138/74 and when I rechecked it was 140/80. She brings in multiple blood pressure readings at home ranging from 104-128 systolically and 63-77 diastolically.    Review of Systems     Objective:   Physical Exam  See above. Blood pressure rechecked 140/80 right arm.      Assessment & Plan:  Elevated blood pressure  Plan: Increase losartan 100 mg daily and return in 2 weeks.

## 2017-07-30 NOTE — Patient Instructions (Signed)
Increased losartan 100 mg daily and return in 2 weeks

## 2017-08-13 ENCOUNTER — Encounter: Payer: Self-pay | Admitting: Internal Medicine

## 2017-08-13 ENCOUNTER — Ambulatory Visit (INDEPENDENT_AMBULATORY_CARE_PROVIDER_SITE_OTHER): Payer: Medicare Other | Admitting: Internal Medicine

## 2017-08-13 VITALS — BP 126/70 | HR 95 | Temp 97.9°F | Wt 166.0 lb

## 2017-08-13 DIAGNOSIS — I10 Essential (primary) hypertension: Secondary | ICD-10-CM

## 2017-08-13 LAB — BASIC METABOLIC PANEL WITH GFR
BUN: 18 mg/dL (ref 7–25)
CO2: 28 mmol/L (ref 20–32)
Calcium: 10 mg/dL (ref 8.6–10.4)
Chloride: 102 mmol/L (ref 98–110)
Creat: 0.65 mg/dL (ref 0.60–0.93)
GFR, Est African American: 104 mL/min/{1.73_m2} (ref >=60)
GFR, Est Non African American: 89 mL/min/{1.73_m2} (ref >=60)
GLUCOSE: 94 mg/dL (ref 65–99)
POTASSIUM: 4.1 mmol/L (ref 3.5–5.3)
SODIUM: 139 mmol/L (ref 135–146)

## 2017-08-13 MED ORDER — SIMVASTATIN 10 MG PO TABS
10.0000 mg | ORAL_TABLET | Freq: Every day | ORAL | 3 refills | Status: DC
Start: 2017-08-13 — End: 2018-08-08

## 2017-08-13 NOTE — Patient Instructions (Addendum)
Continue Losartan 100 mg daily. Blood pressure is now under good control. Basic metabolic panel pending. Return in March for physical examination.

## 2017-08-13 NOTE — Progress Notes (Signed)
   Subjective:    Patient ID: Linda Sosa, female    DOB: [birth year], 71 y.o.   MRN: [id number]  HPI At  last visit on September 21 ,losartan was increased from 50 mg to 100 mg. She brings in multiple blood pressure readings from home which are very acceptable with systolics ranging in the 120s for the most part and diastolics in the 80s for the most part.    Review of Systems     Objective:   Physical Exam Skin warm and dry. Chest clear. Cardiac exam regular rate and rhythm. Extremities without edema. Blood pressure today is 126/70       Assessment & Plan:  Essential hypertension-basic metabolic panel drawn today on increased dose of losartan. I am pleased with blood pressure control at this point time. She will return in March for physical examination.

## 2017-08-26 ENCOUNTER — Other Ambulatory Visit: Payer: Self-pay | Admitting: Internal Medicine

## 2017-08-31 ENCOUNTER — Other Ambulatory Visit: Payer: Self-pay | Admitting: Internal Medicine

## 2017-09-01 ENCOUNTER — Encounter: Payer: Self-pay | Admitting: Endocrinology

## 2017-09-01 ENCOUNTER — Telehealth: Payer: Self-pay | Admitting: Endocrinology

## 2017-09-01 ENCOUNTER — Telehealth: Payer: Self-pay

## 2017-09-01 ENCOUNTER — Ambulatory Visit (INDEPENDENT_AMBULATORY_CARE_PROVIDER_SITE_OTHER): Payer: Medicare Other | Admitting: Endocrinology

## 2017-09-01 VITALS — BP 150/84 | HR 101 | Ht 60.75 in | Wt 165.8 lb

## 2017-09-01 DIAGNOSIS — M810 Age-related osteoporosis without current pathological fracture: Secondary | ICD-10-CM

## 2017-09-01 DIAGNOSIS — E559 Vitamin D deficiency, unspecified: Secondary | ICD-10-CM | POA: Diagnosis not present

## 2017-09-01 NOTE — Telephone Encounter (Signed)
Left vm requesting patient call me back to verify her ID for insurance- trying add patient to the portal for prolia

## 2017-09-01 NOTE — Progress Notes (Signed)
Patient ID: Linda Sosa, female   DOB: May 16, 1946, 71 y.o.   MRN: [id number]           Referring Physician: Lenord FellersBaxley   Chief complaint: Osteoporosis  History of Present Illness:  The patient is referred here for osteoporosis.  She had a screening bone density done in 2011 and this showed the following T-scores: Femoral neck: -2.6 Spine: -3.4  She has a history of osteoporosis and took Fosamax for many years. She was treated with Fosamax since her osteoporosis was diagnosed in 2011 with Fosamax until 2017  She had a bone density study recently in 06/2017 with T score in the spine of -3.3 and T score in the right femoral neck of -2.8.  Last bone density study will prior to that was in 2016 and T score in LS spine was -3.2 and in the left femoral neck -2.6.   She has no history of low trauma fracture; she thinks she may have had about a 1 inch height loss, her previous height was about 5 feet 2 inches Age at menopause: 89, she did not have any HRT after this  Previous treatment: Alendronate weekly Calcium supplements: 600MG  Vitamin D supplements: 2000   LABS: Most recent calcium level has been 10.0, previously lower  Lab Results  Component Value Date   VD25OH 46 01/04/2017   VD25OH 30 01/02/2016     Past Medical History:  Diagnosis Date  . Anemia   . Hyperlipidemia   . Osteoporosis   . Vitamin D deficiency     Past Surgical History:  Procedure Laterality Date  . BREAST EXCISIONAL BIOPSY Right 1991  . BREAST EXCISIONAL BIOPSY Right 1971  . LAPAROSCOPIC CHOLECYSTECTOMY  1997  . TONSILLECTOMY  1952    Family History  Problem Relation Age of Onset  . Cancer Mother   . Diabetes Father   . Heart disease Father   . Colon cancer Neg Hx   . Stomach cancer Neg Hx   . Breast cancer Neg Hx   . Osteoporosis Neg Hx   . Thyroid disease Neg Hx     Social History:  reports that she quit smoking about 33 years ago. Her smoking use included Cigarettes. She has a 5.00  pack-year smoking history. She has never used smokeless tobacco. She reports that she does not drink alcohol or use drugs.  Allergies: No Known Allergies  Allergies as of 09/01/2017   No Known Allergies     Medication List       Accurate as of 09/01/17 12:59 PM. Always use your most recent med list.          aspirin 81 MG tablet Take 81 mg by mouth daily.   calcium carbonate 600 MG Tabs tablet Commonly known as:  OS-CAL Take 600 mg by mouth 2 (two) times daily with a meal.   ferrous sulfate 325 (65 FE) MG tablet Take 325 mg by mouth 2 (two) times daily.   losartan 100 MG tablet Commonly known as:  COZAAR TAKE 1 TABLET BY MOUTH EVERY DAY   multivitamin with minerals tablet Take 1 tablet by mouth daily.   simvastatin 10 MG tablet Commonly known as:  ZOCOR Take 1 tablet (10 mg total) by mouth daily.   Vitamin D 2000 units Caps Take by mouth.        Review of Systems  Constitutional: Positive for weight gain.       This has been mild and gradual for several years  HENT:  Negative for trouble swallowing.   Eyes: Negative for visual disturbance.  Respiratory: Negative for shortness of breath.   Cardiovascular: Negative for leg swelling.  Gastrointestinal: Negative for diarrhea and abdominal pain.  Endocrine: Negative for fatigue.  Musculoskeletal: Negative for muscle cramps.  Skin: Negative for rash.  Neurological: Negative for numbness and tingling.     LABS:  No visits with results within 1 Week(s) from this visit.  Latest known visit with results is:  Office Visit on 08/13/2017  Component Date Value Ref Range Status  . Glucose, Bld 08/13/2017 94  65 - 99 mg/dL Final   Comment: .            Fasting reference interval .   . BUN 08/13/2017 18  7 - 25 mg/dL Final  . Creat 40/98/1191 0.65  0.60 - 0.93 mg/dL Final   Comment: For patients >39 years of age, the reference limit for Creatinine is approximately 13% higher for people identified as  African-American. .   . GFR, Est Non African American 08/13/2017 89  > OR = 60 mL/min/1.48m2 Final  . GFR, Est African American 08/13/2017 104  > OR = 60 mL/min/1.74m2 Final  . BUN/Creatinine Ratio 08/13/2017 NOT APPLICABLE  6 - 22 (calc) Final  . Sodium 08/13/2017 139  135 - 146 mmol/L Final  . Potassium 08/13/2017 4.1  3.5 - 5.3 mmol/L Final  . Chloride 08/13/2017 102  98 - 110 mmol/L Final  . CO2 08/13/2017 28  20 - 32 mmol/L Final  . Calcium 08/13/2017 10.0  8.6 - 10.4 mg/dL Final     PHYSICAL EXAM:  BP (!) 150/84   Pulse (!) 101   Ht 5' 0.75" (1.543 m)   Wt 165 lb 12.8 oz (75.2 kg)   SpO2 98%   BMI 31.59 kg/m   GENERAL: Stocky build  and well nourished  No pallor, clubbing, lymphadenopathy or edema.  No evidence of cushingoid features No thinning of the skin or striae   Skin:  no rash or pigmentation.  EYES:  Externally normal.    ENT: Oral mucosa and tongue normal.  THYROID:  Not palpable.  HEART:  Normal  S1 and S2; no murmur or click.  CHEST:  Normal shape.  Lungs: Vescicular breath sounds heard equally.  No crepitations/ wheeze.  ABDOMEN:  No distention.  Liver and spleen not palpable.  No other mass or tenderness.  JOINTS:  Normal.  SPINE: She has mild thoracic kyphosis without any initially prominent vertebral spines or tenderness  NEUROLOGICAL: .Reflexes are normal bilaterally at biceps.   ASSESSMENT:   OSTEOPOROSIS since at least 2011 No history of fracture Has had mild height loss since her youth Most likely her osteoporosis is postmenopausal especially since she has had a history of early menopause  Vitamin D level has been normal and has not had any evidence of hypercalcemia or other systemic disease that Is potentially causing osteoporosis  Since she has already taken about 6 years of Fosamax Without improvement in bone density needs an alternative treatment. Repeat treatment with this may not benefit her long-term and may potentially cause  atypical fractures with further usage  With her bone density continuing to be about the same at baseline in 2011 would recommend a course of treatment with Prolia Discussed how Prolia works to improve osteoporosis, injection frequency, possible side effects, monitoring of calcium with treatment and given her patient information brochure on this   PLAN:   She will have Prolia prior authorized through her  insurance and will administer this when available and repeat every 6 months She will also have basic metabolic panel checked before her next injection especially to monitor calcium   Consultation note sent to referring physician   [facility], [place] 09/01/2017, 12:59 PM

## 2017-09-01 NOTE — Telephone Encounter (Signed)
Patient states she is returning call from lisa regarding her osteoporosis injections.  Best number to return call is [phone]

## 2017-09-02 NOTE — Telephone Encounter (Signed)
Spoke to the patient have tried to run her through the portal for Prolia but it keeps telling me the member ID is wrong- patient going to call and verify member ID with insurance and call me back

## 2017-09-03 NOTE — Telephone Encounter (Signed)
Patient called and she stated medicare told her we have the right number for the member ID which is [id number] but when I run it through the portal I get an error stating it is not a valid number for the patient I [facility] and I am waiting to hear back from her

## 2017-09-08 ENCOUNTER — Telehealth: Payer: Self-pay

## 2017-09-08 NOTE — Telephone Encounter (Signed)
Spoke with the patient today about getting Prolia injection I told her that her out-of-pocket cost after insurance was $230, no PA was needed, and she was ready to schedule- patient stated she would have financial trouble with this so I gave her the number to the [employer] so she could apply for the grant- also scheduled for her to come in 09/14/17 for Prolia injection but requested she call me back an let me know if she was approved through [employer]- patient agreed to do this

## 2017-09-09 NOTE — Telephone Encounter (Signed)
Spoke with patient and she will call back to let me know if she was approved by [employer]

## 2017-09-14 ENCOUNTER — Telehealth: Payer: Self-pay | Admitting: Endocrinology

## 2017-09-14 ENCOUNTER — Ambulatory Visit (INDEPENDENT_AMBULATORY_CARE_PROVIDER_SITE_OTHER): Payer: Medicare Other

## 2017-09-14 DIAGNOSIS — M810 Age-related osteoporosis without current pathological fracture: Secondary | ICD-10-CM

## 2017-09-14 MED ORDER — DENOSUMAB 60 MG/ML ~~LOC~~ SOLN
60.0000 mg | Freq: Once | SUBCUTANEOUS | Status: AC
  Administered 2017-09-14: 60 mg via SUBCUTANEOUS

## 2017-09-14 NOTE — Telephone Encounter (Signed)
LM for pt to call back to schedule °

## 2017-09-14 NOTE — Telephone Encounter (Signed)
-----   Message from Reather LittlerAjay Kumar, MD sent at 09/14/2017 11:44 AM EST ----- Regarding: Follow-up Patient needs follow-up with labs in 6 months, none scheduled

## 2017-10-14 ENCOUNTER — Telehealth: Payer: Self-pay | Admitting: Endocrinology

## 2017-10-14 NOTE — Telephone Encounter (Signed)
PT Received [employer] reimbursement denial that they Need explanation of benefit form.

## 2017-10-20 NOTE — Telephone Encounter (Signed)
Pt [facility] Please contact pt.

## 2017-10-26 NOTE — Telephone Encounter (Signed)
Patient is asking for a call  back 

## 2017-11-04 NOTE — Telephone Encounter (Signed)
I have the form which I got off [employer] website, filled out the patient portion, and spoke to the patient asking her if she wanted to pick up the form or have me mail it- I explained that she did not sent the EOB in to the foundation within allotted 30 days required by [employer] which I made her aware of in the beginning- patient stated she should not have to do this and wanted to know if she would have to do this every time she needs Prolia- I explained that the grant is for 1 year once she is approved so she will not have to do it again until next year- patient was reluctant to continue with this because she said it was a lot of trouble and she does not understand why she has to do it since she has good insurance-patient did agree to pick up the form on 11/05/17

## 2017-11-04 NOTE — Telephone Encounter (Signed)
I have discussed this with [facility] and the patient will need to fill out a form to get reimbursed she will bring me the form today

## 2017-12-08 ENCOUNTER — Other Ambulatory Visit: Payer: Self-pay

## 2017-12-08 DIAGNOSIS — Z8639 Personal history of other endocrine, nutritional and metabolic disease: Secondary | ICD-10-CM

## 2017-12-08 DIAGNOSIS — Z1321 Encounter for screening for nutritional disorder: Secondary | ICD-10-CM

## 2017-12-08 DIAGNOSIS — Z0000 Encounter for general adult medical examination without abnormal findings: Secondary | ICD-10-CM

## 2017-12-08 DIAGNOSIS — E785 Hyperlipidemia, unspecified: Secondary | ICD-10-CM

## 2017-12-08 DIAGNOSIS — M810 Age-related osteoporosis without current pathological fracture: Secondary | ICD-10-CM

## 2017-12-08 DIAGNOSIS — I10 Essential (primary) hypertension: Secondary | ICD-10-CM

## 2018-01-06 ENCOUNTER — Other Ambulatory Visit: Payer: Medicare Other | Admitting: Internal Medicine

## 2018-01-06 DIAGNOSIS — M810 Age-related osteoporosis without current pathological fracture: Secondary | ICD-10-CM

## 2018-01-06 DIAGNOSIS — E785 Hyperlipidemia, unspecified: Secondary | ICD-10-CM | POA: Diagnosis not present

## 2018-01-06 DIAGNOSIS — I10 Essential (primary) hypertension: Secondary | ICD-10-CM

## 2018-01-06 DIAGNOSIS — Z0000 Encounter for general adult medical examination without abnormal findings: Secondary | ICD-10-CM

## 2018-01-06 DIAGNOSIS — Z8639 Personal history of other endocrine, nutritional and metabolic disease: Secondary | ICD-10-CM | POA: Diagnosis not present

## 2018-01-06 DIAGNOSIS — Z1321 Encounter for screening for nutritional disorder: Secondary | ICD-10-CM

## 2018-01-06 NOTE — Addendum Note (Signed)
Addended by: Gregery NaVALENCIA, Sherran Margolis P on: 01/06/2018 11:01 AM   Modules accepted: Orders

## 2018-01-07 LAB — LIPID PANEL
Cholesterol: 178 mg/dL (ref <200)
HDL: 56 mg/dL (ref >50)
LDL CHOLESTEROL (CALC): 96 mg/dL
NON-HDL CHOLESTEROL (CALC): 122 mg/dL (ref <130)
TRIGLYCERIDES: 166 mg/dL — AB (ref <150)
Total CHOL/HDL Ratio: 3.2 (calc) (ref <5.0)

## 2018-01-07 LAB — COMPLETE METABOLIC PANEL WITH GFR
AG RATIO: 2 (calc) (ref 1.0–2.5)
ALBUMIN MSPROF: 4.3 g/dL (ref 3.6–5.1)
ALT: 13 U/L (ref 6–29)
AST: 18 U/L (ref 10–35)
Alkaline phosphatase (APISO): 57 U/L (ref 33–130)
BUN: 18 mg/dL (ref 7–25)
CALCIUM: 9 mg/dL (ref 8.6–10.4)
CO2: 29 mmol/L (ref 20–32)
Chloride: 104 mmol/L (ref 98–110)
Creat: 0.61 mg/dL (ref 0.60–0.93)
GFR, EST AFRICAN AMERICAN: 105 mL/min/{1.73_m2} (ref >=60)
GFR, EST NON AFRICAN AMERICAN: 91 mL/min/{1.73_m2} (ref >=60)
Globulin: 2.2 g/dL (calc) (ref 1.9–3.7)
Glucose, Bld: 95 mg/dL (ref 65–99)
Potassium: 4.2 mmol/L (ref 3.5–5.3)
Sodium: 140 mmol/L (ref 135–146)
TOTAL PROTEIN: 6.5 g/dL (ref 6.1–8.1)
Total Bilirubin: 0.4 mg/dL (ref 0.2–1.2)

## 2018-01-07 LAB — CBC WITH DIFFERENTIAL/PLATELET
BASOS PCT: 0.6 %
Basophils Absolute: 41 cells/uL (ref 0–200)
Eosinophils Absolute: 104 cells/uL (ref 15–500)
Eosinophils Relative: 1.5 %
HCT: 39.4 % (ref 35.0–45.0)
HEMOGLOBIN: 13.1 g/dL (ref 11.7–15.5)
LYMPHS ABS: 2036 {cells}/uL (ref 850–3900)
MCH: 28 pg (ref 27.0–33.0)
MCHC: 33.2 g/dL (ref 32.0–36.0)
MCV: 84.2 fL (ref 80.0–100.0)
MPV: 10.8 fL (ref 7.5–12.5)
Monocytes Relative: 7.3 %
NEUTROS ABS: 4216 {cells}/uL (ref 1500–7800)
Neutrophils Relative %: 61.1 %
Platelets: 261 10*3/uL (ref 140–400)
RBC: 4.68 10*6/uL (ref 3.80–5.10)
RDW: 12.5 % (ref 11.0–15.0)
TOTAL LYMPHOCYTE: 29.5 %
WBC: 6.9 10*3/uL (ref 3.8–10.8)
WBCMIX: 504 {cells}/uL (ref 200–950)

## 2018-01-07 LAB — VITAMIN D 25 HYDROXY (VIT D DEFICIENCY, FRACTURES): VIT D 25 HYDROXY: 41 ng/mL (ref 30–100)

## 2018-01-07 LAB — VITAMIN B12: Vitamin B-12: 484 pg/mL (ref 200–1100)

## 2018-01-07 LAB — TSH: TSH: 1.58 m[IU]/L (ref 0.40–4.50)

## 2018-01-10 ENCOUNTER — Ambulatory Visit (INDEPENDENT_AMBULATORY_CARE_PROVIDER_SITE_OTHER): Payer: Medicare Other | Admitting: Internal Medicine

## 2018-01-10 ENCOUNTER — Encounter: Payer: Self-pay | Admitting: Internal Medicine

## 2018-01-10 VITALS — BP 160/70 | HR 102 | Ht 59.5 in | Wt 166.0 lb

## 2018-01-10 DIAGNOSIS — E781 Pure hyperglyceridemia: Secondary | ICD-10-CM | POA: Diagnosis not present

## 2018-01-10 DIAGNOSIS — M810 Age-related osteoporosis without current pathological fracture: Secondary | ICD-10-CM | POA: Diagnosis not present

## 2018-01-10 DIAGNOSIS — I10 Essential (primary) hypertension: Secondary | ICD-10-CM | POA: Diagnosis not present

## 2018-01-10 DIAGNOSIS — R8290 Unspecified abnormal findings in urine: Secondary | ICD-10-CM | POA: Diagnosis not present

## 2018-01-10 DIAGNOSIS — Z0000 Encounter for general adult medical examination without abnormal findings: Secondary | ICD-10-CM | POA: Diagnosis not present

## 2018-01-10 LAB — POCT URINALYSIS DIPSTICK
Appearance: ABNORMAL
Bilirubin, UA: NEGATIVE
Glucose, UA: NEGATIVE
KETONES UA: NEGATIVE
NITRITE UA: NEGATIVE
Odor: ABNORMAL
Protein, UA: NEGATIVE
RBC UA: NEGATIVE
SPEC GRAV UA: 1.015 (ref 1.010–1.025)
UROBILINOGEN UA: 0.2 U/dL
pH, UA: 6.5 (ref 5.0–8.0)

## 2018-01-10 NOTE — Progress Notes (Signed)
Subjective:    Patient ID: Linda Sosa, female    DOB: Oct 25, 1946, 72 y.o.   MRN: [id number]  HPI 72 year old White Female in today for health maintenance exam, Medicare wellness and evaluation of medical issues.  History of hypertension.  Currently it is well controlled on losartan 100 mg daily.  She brings in multiple blood pressure readings that are very acceptable over the past 2 months.  She feels well on this medication.  She has a history of hyperlipidemia.  She is on Zocor 10 mg daily.  History of osteoporosis treated with Fosamax.  History of vitamin D deficiency.  She developed microcytic anemia in 2014.  Anemia improved with iron supplementation and she had colonoscopy February 2014 which was her first colonoscopy which was normal except for diverticulosis.  She has had no further recurrence of her anemia.  Past medical history: Cholecystectomy 1997.  Was found to be vitamin D deficient in 2010.  Last Pap smear 2014 was normal.  Social history: Does not smoke or consume alcohol.  Enjoys gardening and traveling.  She is married.  Husband has history of non-Hodgkin's lymphoma but doing fairly well.  Patient does not work outside the home.  She completed 2 years of college.  Family history: Father died at 100 with heart problems and history of diabetes.  Mother died at age 109 of ovarian cancer.  One brother died with heart problems.  2 sisters in fairly good health but one is a diabetic.  One daughter in good health.    Review of Systems  Constitutional: Negative.   All other systems reviewed and are negative.      Objective:   Physical Exam  Constitutional: She is oriented to person, place, and time. She appears well-developed and well-nourished. No distress.  HENT:  Head: Normocephalic and atraumatic.  Right Ear: External ear normal.  Left Ear: External ear normal.  Mouth/Throat: Oropharynx is clear and moist.  Eyes: Pupils are equal, round, and reactive to  light. Conjunctivae and EOM are normal. Right eye exhibits no discharge. Left eye exhibits no discharge. No scleral icterus.  Neck: Neck supple. No JVD present. No thyromegaly present.  Cardiovascular: Normal rate, regular rhythm, normal heart sounds and intact distal pulses.  No murmur heard. Pulmonary/Chest: Breath sounds normal. She has no wheezes. She has no rales.  Breast normal female  Abdominal: Soft. Bowel sounds are normal. She exhibits no distension and no mass. There is no tenderness. There is no rebound.  Genitourinary:  Genitourinary Comments: Bimanual exam normal  Musculoskeletal: She exhibits no edema.  Lymphadenopathy:    She has no cervical adenopathy.  Neurological: She is alert and oriented to person, place, and time. She has normal reflexes. No cranial nerve deficit. Coordination normal.  Skin: Skin is warm and dry. No rash noted. She is not diaphoretic.  Psychiatric: She has a normal mood and affect. Her behavior is normal. Judgment and thought content normal.  Vitals reviewed.         Assessment & Plan:  Essential hypertension-this is stable and she brings in multiple blood pressure readings that are very acceptable  Osteoporosis-off Fosamax since 2017.  Bone density study August 2018 showed osteoporosis -3.3 in  lumbar spine.  Right femoral neck -2.8.  This seems to be stable at the present time.  T score was -3.2 in 2016 and the LS spine and was -2.6 in the left femoral neck.  Office hypertension-blood pressure readings from home are acceptable but blood pressure  is elevated today at 160/70.  BMI is 32.97 continue to work on diet and exercise.  Follow-up in September for 9027-month recheck  Subjective:   Patient presents for Medicare Annual/Subsequent preventive examination.  Review Past Medical/Family/Social: See above  Risk Factors  Current exercise habits: Yard work Dietary issues discussed: Low-fat low carbohydrate  Cardiac risk factors: Family  history of cardiac issues in father.  One brother with history of heart problems.  Patient has hyperlipidemia.  Depression Screen  (Note: if answer to either of the following is "Yes", a more complete depression screening is indicated)   Over the past two weeks, have you felt down, depressed or hopeless? No  Over the past two weeks, have you felt little interest or pleasure in doing things? No Have you lost interest or pleasure in daily life? No Do you often feel hopeless? No Do you cry easily over simple problems? No   Activities of Daily Living  In your present state of health, do you have any difficulty performing the following activities?:   Driving? No  Managing money? No  Feeding yourself? No  Getting from bed to chair? No  Climbing a flight of stairs? No  Preparing food and eating?: No  Bathing or showering? No  Getting dressed: No  Getting to the toilet? No  Using the toilet:No  Moving around from place to place: No  In the past year have you fallen or had a near fall?:No  Are you sexually active? No  Do you have more than one partner? No   Hearing Difficulties: No  Do you often ask people to speak up or repeat themselves? No  Do you experience ringing or noises in your ears? No  Do you have difficulty understanding soft or whispered voices? No  Do you feel that you have a problem with memory? No Do you often misplace items? No    Home Safety:  Do you have a smoke alarm at your residence? Yes Do you have grab bars in the bathroom?  Yes Do you have throw rugs in your house?  No   Cognitive Testing  Alert? Yes Normal Appearance?Yes  Oriented to person? Yes Place? Yes  Time? Yes  Recall of three objects? Yes  Can perform simple calculations? Yes  Displays appropriate judgment?Yes  Can read the correct time from a watch face?Yes   List the Names of Other Physician/Practitioners you currently use:  See referral list for the physicians patient is currently  seeing.     Review of Systems: See above  Objective:     General appearance: Appears stated age and mildly obese  Head: Normocephalic, without obvious abnormality, atraumatic  Eyes: conj clear, EOMi PEERLA  Ears: normal TM's and external ear canals both ears  Nose: Nares normal. Septum midline. Mucosa normal. No drainage or sinus tenderness.  Throat: lips, mucosa, and tongue normal; teeth and gums normal  Neck: no adenopathy, no carotid bruit, no JVD, supple, symmetrical, trachea midline and thyroid not enlarged, symmetric, no tenderness/mass/nodules  No CVA tenderness.  Lungs: clear to auscultation bilaterally  Breasts: normal appearance, no masses or tenderness Heart: regular rate and rhythm, S1, S2 normal, no murmur, click, rub or gallop  Abdomen: soft, non-tender; bowel sounds normal; no masses, no organomegaly  Musculoskeletal: ROM normal in all joints, no crepitus, no deformity, Normal muscle strengthen. Back  is symmetric, no curvature. Skin: Skin color, texture, turgor normal. No rashes or lesions  Lymph nodes: Cervical, supraclavicular, and axillary nodes normal.  Neurologic: CN 2 -12 Normal, Normal symmetric reflexes. Normal coordination and gait  Psych: Alert & Oriented x 3, Mood appear stable.    Assessment:    Annual wellness medicare exam   Plan:    During the course of the visit the patient was educated and counseled about appropriate screening and preventive services including:   Annual mammogram  Bone density every 2 years.  Annual flu vaccine     Patient Instructions (the written plan) was given to the patient.  Medicare Attestation  I have personally reviewed:  The patient's medical and social history  Their use of alcohol, tobacco or illicit drugs  Their current medications and supplements  The patient's functional ability including ADLs,fall risks, home safety risks, cognitive, and hearing and visual impairment  Diet and physical activities    Evidence for depression or mood disorders  The patient's weight, height, BMI, and visual acuity have been recorded in the chart. I have made referrals, counseling, and provided education to the patient based on review of the above and I have provided the patient with a written personalized care plan for preventive services.

## 2018-01-11 LAB — URINE CULTURE
MICRO NUMBER:: 90276274
SPECIMEN QUALITY:: ADEQUATE

## 2018-02-05 NOTE — Patient Instructions (Signed)
Continue same medications and follow-up in September for 4334-month recheck.

## 2018-03-15 ENCOUNTER — Ambulatory Visit: Payer: Medicare Other

## 2018-03-15 ENCOUNTER — Encounter: Payer: Self-pay | Admitting: Endocrinology

## 2018-03-15 ENCOUNTER — Ambulatory Visit: Payer: Medicare Other | Admitting: Endocrinology

## 2018-03-15 VITALS — BP 138/80 | HR 95 | Ht 60.0 in | Wt 168.6 lb

## 2018-03-15 DIAGNOSIS — M810 Age-related osteoporosis without current pathological fracture: Secondary | ICD-10-CM

## 2018-03-15 MED ORDER — DENOSUMAB 60 MG/ML ~~LOC~~ SOSY
60.0000 mg | PREFILLED_SYRINGE | Freq: Once | SUBCUTANEOUS | Status: AC
  Administered 2018-03-15: 60 mg via SUBCUTANEOUS

## 2018-03-15 NOTE — Progress Notes (Signed)
Patient ID: Linda Sosa, female   DOB: [birth year], 72 y.o.   MRN: [id number]           Referring Physician: Lenord Fellers   Chief complaint: Osteoporosis follow-up  History of Present Illness:   Initial history obtained: She had a screening bone density done in 2011 and this showed the following T-scores: Femoral neck: -2.6 Spine: -3.4  She was treated with Fosamax since her osteoporosis was diagnosed in 2011 with Fosamax until 2017  She had a bone density study in 06/2017 with T score in the spine of -3.3 and T score in the right femoral neck of -2.8.  Last bone density study done prior to that was in 2016 and T score in LS spine was -3.2 and in the left femoral neck -2.6.   She has no history of low trauma fracture She has had some height loss, her previous height was about 5 feet 2 inches Age at menopause: 54, she did not have any HRT after this  Calcium supplements:  once daily  Vitamin D supplements: 2000 Units  TREATMENT:  Because of her persistent and worsening osteoporosis she was switched from Fosamax to Prolia after her initial visit in 10/18, her first injection was on 09/14/2017 She had no side effects from this   LABS: Most recent calcium level has been 10.0, previously lower  Lab Results  Component Value Date   VD25OH 41 01/06/2018   VD25OH 46 01/04/2017     Past Medical History:  Diagnosis Date  . Anemia   . Hyperlipidemia   . Osteoporosis   . Vitamin D deficiency     Past Surgical History:  Procedure Laterality Date  . BREAST EXCISIONAL BIOPSY Right 1991  . BREAST EXCISIONAL BIOPSY Right 1971  . LAPAROSCOPIC CHOLECYSTECTOMY  1997  . TONSILLECTOMY  1952    Family History  Problem Relation Age of Onset  . Cancer Mother   . Diabetes Father   . Heart disease Father   . Colon cancer Neg Hx   . Stomach cancer Neg Hx   . Breast cancer Neg Hx   . Osteoporosis Neg Hx   . Thyroid disease Neg Hx     Social History:  reports that she quit  smoking about 34 years ago. Her smoking use included cigarettes. She has a 5.00 pack-year smoking history. She has never used smokeless tobacco. She reports that she does not drink alcohol or use drugs.  Allergies: No Known Allergies  Allergies as of 03/15/2018   No Known Allergies     Medication List        Accurate as of 03/15/18 10:08 AM. Always use your most recent med list.          aspirin 81 MG tablet Take 81 mg by mouth daily.   calcium carbonate 600 MG Tabs tablet Commonly known as:  OS-CAL Take 600 mg by mouth 2 (two) times daily with a meal.   ferrous sulfate 325 (65 FE) MG tablet Take 325 mg by mouth 2 (two) times daily.   losartan 100 MG tablet Commonly known as:  COZAAR TAKE 1 TABLET BY MOUTH EVERY DAY   multivitamin with minerals tablet Take 1 tablet by mouth daily.   simvastatin 10 MG tablet Commonly known as:  ZOCOR Take 1 tablet (10 mg total) by mouth daily.   Vitamin D 2000 units Caps Take by mouth.        Review of Systems  Her hypertension is being managed by her  PCP   PHYSICAL EXAM:  BP 138/80 (BP Location: Left Arm, Patient Position: Sitting, Cuff Size: Normal)   Pulse 95   Ht 5' (1.524 m)   Wt 168 lb 9.6 oz (76.5 kg)   SpO2 98%   BMI 32.93 kg/m    SPINE: She has upper thoracic kyphosis without tenderness or prominent spines    ASSESSMENT:   OSTEOPOROSIS since at least 2011, failed treatment with Fosamax She has had height loss and thoracic kyphosis as manifestations with the last T score of -3.3  She has come back for her second injection of Prolia which she appears to be tolerating well Reassured that this is a safe long-term  Vitamin D level has been normal and she will continue the current supplement    PLAN:   She will have Prolia every 6 months including today Increase calcium to twice a day Bone density at the end of the year after her next visit   Reather Littler 03/15/2018, 10:08 AM

## 2018-03-15 NOTE — Patient Instructions (Addendum)
Calcium 2x  Daily   Walk daily

## 2018-04-29 ENCOUNTER — Other Ambulatory Visit: Payer: Self-pay | Admitting: Internal Medicine

## 2018-04-29 DIAGNOSIS — Z1231 Encounter for screening mammogram for malignant neoplasm of breast: Secondary | ICD-10-CM

## 2018-05-04 ENCOUNTER — Ambulatory Visit
Admission: RE | Admit: 2018-05-04 | Discharge: 2018-05-04 | Disposition: A | Discharge status: Home / Self Care | Payer: Medicare Other | Source: Ambulatory Visit | Attending: Internal Medicine | Admitting: Internal Medicine

## 2018-05-04 DIAGNOSIS — Z1231 Encounter for screening mammogram for malignant neoplasm of breast: Secondary | ICD-10-CM | POA: Diagnosis not present

## 2018-05-26 ENCOUNTER — Other Ambulatory Visit: Payer: Self-pay | Admitting: Internal Medicine

## 2018-07-06 ENCOUNTER — Other Ambulatory Visit: Payer: Self-pay | Admitting: Internal Medicine

## 2018-07-06 DIAGNOSIS — Z5181 Encounter for therapeutic drug level monitoring: Secondary | ICD-10-CM

## 2018-07-06 DIAGNOSIS — Z79899 Other long term (current) drug therapy: Principal | ICD-10-CM

## 2018-07-06 DIAGNOSIS — E781 Pure hyperglyceridemia: Secondary | ICD-10-CM

## 2018-07-07 ENCOUNTER — Other Ambulatory Visit: Payer: Self-pay | Admitting: Internal Medicine

## 2018-07-12 ENCOUNTER — Other Ambulatory Visit: Payer: Medicare Other | Admitting: Internal Medicine

## 2018-07-12 DIAGNOSIS — Z79899 Other long term (current) drug therapy: Secondary | ICD-10-CM | POA: Diagnosis not present

## 2018-07-12 DIAGNOSIS — E781 Pure hyperglyceridemia: Secondary | ICD-10-CM

## 2018-07-12 DIAGNOSIS — Z5181 Encounter for therapeutic drug level monitoring: Secondary | ICD-10-CM | POA: Diagnosis not present

## 2018-07-12 LAB — HEPATIC FUNCTION PANEL
AG Ratio: 2.2 (calc) (ref 1.0–2.5)
ALT: 11 U/L (ref 6–29)
AST: 15 U/L (ref 10–35)
Albumin: 4.4 g/dL (ref 3.6–5.1)
Alkaline phosphatase (APISO): 53 U/L (ref 33–130)
BILIRUBIN DIRECT: 0.1 mg/dL (ref 0.0–0.2)
BILIRUBIN INDIRECT: 0.2 mg/dL (ref 0.2–1.2)
GLOBULIN: 2 g/dL (ref 1.9–3.7)
Total Bilirubin: 0.3 mg/dL (ref 0.2–1.2)
Total Protein: 6.4 g/dL (ref 6.1–8.1)

## 2018-07-12 LAB — LIPID PANEL
CHOL/HDL RATIO: 3 (calc) (ref <5.0)
Cholesterol: 172 mg/dL (ref <200)
HDL: 58 mg/dL (ref >50)
LDL Cholesterol (Calc): 88 mg/dL (calc)
Non-HDL Cholesterol (Calc): 114 mg/dL (calc) (ref <130)
TRIGLYCERIDES: 164 mg/dL — AB (ref <150)

## 2018-07-14 ENCOUNTER — Ambulatory Visit: Payer: Medicare Other | Admitting: Internal Medicine

## 2018-07-14 ENCOUNTER — Encounter: Payer: Self-pay | Admitting: Internal Medicine

## 2018-07-14 VITALS — BP 130/80 | HR 85 | Ht 60.0 in | Wt 166.0 lb

## 2018-07-14 DIAGNOSIS — Z23 Encounter for immunization: Secondary | ICD-10-CM | POA: Diagnosis not present

## 2018-07-14 DIAGNOSIS — I10 Essential (primary) hypertension: Secondary | ICD-10-CM

## 2018-07-14 DIAGNOSIS — E781 Pure hyperglyceridemia: Secondary | ICD-10-CM | POA: Diagnosis not present

## 2018-07-14 NOTE — Progress Notes (Signed)
   Subjective:    Patient ID: Linda Sosa, female    DOB: 1946-04-24, 72 y.o.   MRN: [id number]  HPI Patient in today for 34-month recheck.  BP 140 systolic today. Had medication at 5am. Better BP readings at home in the 120 systolic range.  Has persistent hypertriglyceridemia in the 160 range.  Triglycerides persistently elevated recently at 164 with normal total cholesterol and LDL cholesterol.  No new complaints or problems.  Flu vaccine given today.      Review of Systems feels well with no new complaints     Objective:   Physical Exam  Neck supple.  No JVD thyromegaly or carotid bruits.  Chest clear to auscultation.  Cardiac exam regular rate and rhythm normal S1 and S2.  Extremities without edema.      Assessment & Plan:  Hypertriglyceridemia-increase Zocor to 20 mg daily  Essential hypertension-blood pressure rechecked 130/80 -no change in antihypertension medication  Plan: Increase Zocor to 20 mg daily and follow-up in late January.  Flu vaccine given today

## 2018-07-14 NOTE — Patient Instructions (Addendum)
Increase Zocor to 20 mg daily. Recheck in January.  Flu vaccine given.

## 2018-08-08 ENCOUNTER — Other Ambulatory Visit: Payer: Self-pay | Admitting: Internal Medicine

## 2018-09-11 ENCOUNTER — Other Ambulatory Visit: Payer: Self-pay | Admitting: Endocrinology

## 2018-09-11 DIAGNOSIS — M810 Age-related osteoporosis without current pathological fracture: Secondary | ICD-10-CM

## 2018-09-12 ENCOUNTER — Other Ambulatory Visit (INDEPENDENT_AMBULATORY_CARE_PROVIDER_SITE_OTHER): Payer: Medicare Other

## 2018-09-12 DIAGNOSIS — M810 Age-related osteoporosis without current pathological fracture: Secondary | ICD-10-CM

## 2018-09-12 LAB — BASIC METABOLIC PANEL
BUN: 28 mg/dL — ABNORMAL HIGH (ref 6–23)
CHLORIDE: 104 meq/L (ref 96–112)
CO2: 27 mEq/L (ref 19–32)
Calcium: 9.5 mg/dL (ref 8.4–10.5)
Creatinine, Ser: 0.68 mg/dL (ref 0.40–1.20)
GFR: 90.15 mL/min (ref >60.00)
GLUCOSE: 105 mg/dL — AB (ref 70–99)
POTASSIUM: 4 meq/L (ref 3.5–5.1)
SODIUM: 139 meq/L (ref 135–145)

## 2018-09-15 ENCOUNTER — Encounter: Payer: Self-pay | Admitting: Endocrinology

## 2018-09-15 ENCOUNTER — Ambulatory Visit: Payer: Medicare Other | Admitting: Endocrinology

## 2018-09-15 ENCOUNTER — Telehealth: Payer: Self-pay

## 2018-09-15 ENCOUNTER — Telehealth: Payer: Self-pay | Admitting: Endocrinology

## 2018-09-15 VITALS — BP 148/86 | HR 80 | Ht 60.25 in | Wt 166.0 lb

## 2018-09-15 DIAGNOSIS — M810 Age-related osteoporosis without current pathological fracture: Secondary | ICD-10-CM | POA: Diagnosis not present

## 2018-09-15 NOTE — Telephone Encounter (Signed)
Patient came in today requesting prolia but I went back to the last time I verified her which was 09/14/17 and on that visit patient did not feel she would continue with [employer] and would contact me if she changed her mind- since I did not hear from her I did not re-verify her through the Prolia portal- patient came in on 03/15/18 for an office visit and was given Prolia even though she has not been verified through her insurance- patient today stated she would like to try [employer] again and will call me back once she has been approved so that we can order Prolia for her

## 2018-09-15 NOTE — Progress Notes (Signed)
Patient ID: Linda Sosa, female   DOB: Jun 29, 1946, 72 y.o.   MRN: [id number]           Referring Physician: Lenord Fellers   Chief complaint: Osteoporosis follow-up  History of Present Illness:   Initial history obtained: She had a screening bone density done in 2011 and this showed the following T-scores: Femoral neck: -2.6 Spine: -3.4  She was treated with Fosamax since her osteoporosis was diagnosed in 2011 with Fosamax until 2017  She had a bone density study in 06/2017 with T score in the spine of -3.3 and T score in the right femoral neck of -2.8.  Last bone density study done prior to that was in 2016 and T score in LS spine was -3.2 and in the left femoral neck -2.6.   She has no history of low trauma fracture She has had some height loss, her previous height was about 5 feet 2 inches Age at menopause: 23, she did not have any HRT after this  Calcium supplements: 600MG  once daily  Vitamin D supplements: 2000 Units  TREATMENT:  Because of her persistent and worsening osteoporosis she was switched from Fosamax to Prolia after her initial visit in 10/18, her first injection was on 09/14/2017 She had her second injection on 03/15/2018 She had no side effects from taking Prolia She continues to take her calcium and D as above She is now concerned about the cost of Prolia  No recent back pain  LABS: Although previously calcium had been high normal at this point good recently  Lab Results  Component Value Date   VD25OH 41 01/06/2018   VD25OH 46 01/04/2017   Lab Results  Component Value Date   CALCIUM 9.5 09/12/2018      Past Medical History:  Diagnosis Date  . Anemia   . Hyperlipidemia   . Osteoporosis   . Vitamin D deficiency     Past Surgical History:  Procedure Laterality Date  . BREAST EXCISIONAL BIOPSY Right 1991  . BREAST EXCISIONAL BIOPSY Right 1971  . LAPAROSCOPIC CHOLECYSTECTOMY  1997  . TONSILLECTOMY  1952    Family History  Problem Relation  Age of Onset  . Cancer Mother   . Diabetes Father   . Heart disease Father   . Colon cancer Neg Hx   . Stomach cancer Neg Hx   . Breast cancer Neg Hx   . Osteoporosis Neg Hx   . Thyroid disease Neg Hx     Social History:  reports that she quit smoking about 34 years ago. Her smoking use included cigarettes. She has a 5.00 pack-year smoking history. She has never used smokeless tobacco. She reports that she does not drink alcohol or use drugs.  Allergies: No Known Allergies  Allergies as of 09/15/2018   No Known Allergies     Medication List        Accurate as of 09/15/18  8:39 AM. Always use your most recent med list.          aspirin 81 MG tablet Take 81 mg by mouth daily.   calcium carbonate 600 MG Tabs tablet Commonly known as:  OS-CAL Take 600 mg by mouth 2 (two) times daily with a meal.   ferrous sulfate 325 (65 FE) MG tablet Take 325 mg by mouth 2 (two) times daily.   losartan 100 MG tablet Commonly known as:  COZAAR TAKE 1 TABLET BY MOUTH EVERY DAY   multivitamin with minerals tablet Take 1 tablet by mouth  daily.   simvastatin 10 MG tablet Commonly known as:  ZOCOR TAKE 1 TABLET BY MOUTH EVERY DAY   Vitamin D 50 MCG (2000 UT) Caps Take by mouth.        Review of Systems  Her hypertension is being managed by her PCP with losartan   PHYSICAL EXAM:  BP (!) 148/86   Pulse 80   Ht 5' (1.524 m)   Wt 166 lb (75.3 kg)   SpO2 99%   BMI 32.42 kg/m    SPINE: She has mild upper thoracic kyphosis without tenderness or prominent spinous processes Lumbar spine is normal   ASSESSMENT:   OSTEOPOROSIS since at least 2011, failed treatment with Fosamax She has had height loss and thoracic kyphosis which has been stable Baseline T score of -3.3 before starting Prolia  Vitamin D level has been normal and she will continue the current regimen of 2000 units daily along with calcium    PLAN:   She will have Prolia every 6 months Currently waiting  for patient assistance program and will come back when this is approved Given contact information for this today  Bone density to be done next August  Total visit time 15 minutes  Lamiracle Chaidez 09/15/2018, 8:39 AM

## 2018-09-15 NOTE — Telephone Encounter (Signed)
Patient is stating this is from [facility]: Card Id: [id number] Group #: [id number] Vin #: [id number] PCN #: [id number] Start 10/19  Need this sent to pharmacy for prolia shot. Ph # [phone]

## 2018-09-15 NOTE — Telephone Encounter (Signed)
Spoke with the patient and she will get herself established with [facility] and I will order her prolia prescription- she has nurse visit on 09/20/18 to get injection

## 2018-09-20 ENCOUNTER — Ambulatory Visit (INDEPENDENT_AMBULATORY_CARE_PROVIDER_SITE_OTHER): Payer: Medicare Other

## 2018-09-20 DIAGNOSIS — M810 Age-related osteoporosis without current pathological fracture: Secondary | ICD-10-CM | POA: Diagnosis not present

## 2018-09-20 MED ORDER — DENOSUMAB 60 MG/ML ~~LOC~~ SOSY
60.0000 mg | PREFILLED_SYRINGE | Freq: Once | SUBCUTANEOUS | Status: AC
  Administered 2018-09-20: 60 mg via SUBCUTANEOUS

## 2018-09-20 MED FILL — PROLIA 60 MG/ML SOLN: 60 | 180 days supply | Qty: 1 | Fill #0

## 2018-09-20 NOTE — Progress Notes (Signed)
Per orders of Dr. Gherghe injection of Prolia given today by Sun Kihn, Certified Medical Assistant . Patient tolerated injection well.  

## 2018-11-21 ENCOUNTER — Other Ambulatory Visit: Payer: Self-pay | Admitting: Internal Medicine

## 2018-12-06 ENCOUNTER — Other Ambulatory Visit: Payer: Medicare Other | Admitting: Internal Medicine

## 2018-12-06 DIAGNOSIS — E781 Pure hyperglyceridemia: Secondary | ICD-10-CM | POA: Diagnosis not present

## 2018-12-07 LAB — HEPATIC FUNCTION PANEL
AG Ratio: 1.8 (calc) (ref 1.0–2.5)
ALBUMIN MSPROF: 4.1 g/dL (ref 3.6–5.1)
ALT: 10 U/L (ref 6–29)
AST: 15 U/L (ref 10–35)
Alkaline phosphatase (APISO): 49 U/L (ref 33–130)
BILIRUBIN DIRECT: 0.1 mg/dL (ref 0.0–0.2)
Globulin: 2.3 g/dL (calc) (ref 1.9–3.7)
Indirect Bilirubin: 0.3 mg/dL (calc) (ref 0.2–1.2)
Total Bilirubin: 0.4 mg/dL (ref 0.2–1.2)
Total Protein: 6.4 g/dL (ref 6.1–8.1)

## 2018-12-07 LAB — LIPID PANEL
CHOL/HDL RATIO: 3.1 (calc) (ref <5.0)
Cholesterol: 172 mg/dL (ref <200)
HDL: 55 mg/dL (ref >50)
LDL Cholesterol (Calc): 90 mg/dL (calc)
NON-HDL CHOLESTEROL (CALC): 117 mg/dL (ref <130)
TRIGLYCERIDES: 174 mg/dL — AB (ref <150)

## 2018-12-08 ENCOUNTER — Encounter: Payer: Self-pay | Admitting: Internal Medicine

## 2018-12-08 ENCOUNTER — Ambulatory Visit: Payer: Medicare Other | Admitting: Internal Medicine

## 2018-12-08 VITALS — BP 130/70 | HR 90 | Temp 98.6°F | Ht 60.25 in | Wt 167.0 lb

## 2018-12-08 DIAGNOSIS — M792 Neuralgia and neuritis, unspecified: Secondary | ICD-10-CM

## 2018-12-08 DIAGNOSIS — G609 Hereditary and idiopathic neuropathy, unspecified: Secondary | ICD-10-CM

## 2018-12-08 DIAGNOSIS — E781 Pure hyperglyceridemia: Secondary | ICD-10-CM

## 2018-12-08 DIAGNOSIS — M818 Other osteoporosis without current pathological fracture: Secondary | ICD-10-CM

## 2018-12-08 DIAGNOSIS — I10 Essential (primary) hypertension: Secondary | ICD-10-CM | POA: Diagnosis not present

## 2018-12-08 MED ORDER — SIMVASTATIN 20 MG PO TABS: 20.0000 mg | ORAL_TABLET | Freq: Every day | ORAL | 3 refills | Status: DC

## 2018-12-08 MED ORDER — LOSARTAN POTASSIUM 100 MG PO TABS: 100.0000 mg | ORAL_TABLET | Freq: Every day | ORAL | 1 refills | Status: DC

## 2018-12-08 NOTE — Progress Notes (Signed)
   Subjective:    Patient ID: Linda Sosa, female    DOB: August 05, 1946, 73 y.o.   MRN: [id number]  HPI Started Prolia 2018 with Dr. Lucianne MussKumar. Doing well with no side effects. Here for follow up of other medical issues. Triglycerides elevated at 174 and previously 164 in September.  Prior to that in February 2019 triglycerides were 166.  May be 8 to well over the holidays.  She will watch her diet.  Has some issues with numbness and tingling in her feet.  B12 level was checked and level was 444.  Liver panel normal.  Blood pressure stable    Review of Systems see above     Objective:   Physical Exam Vital signs reviewed blood pressure 130/70 BMI 32.34.  Pulse 90 regular.  Skin warm and dry.  Nodes none.  Neck is supple without JVD thyromegaly or carotid bruits.  Chest clear to auscultation.  Cardiac exam regular rate and rhythm normal S1 and S2.  Extremities without edema.       Assessment & Plan:  Osteoporosis treated by Dr. Lucianne MussKumar with Prolia  Hypertriglyceridemia treated with Zocor 20 mg daily.  Continue to work on diet exercise and follow-up in 6 months  Peripheral neuropathy-check B12 level.  Likely idiopathic.  She is not a diabetic  Essential hypertension-stable on current medication  Plan: Continue current medications.  Continue to work on diet and exercise.  Follow-up in 6 months.

## 2018-12-09 LAB — VITAMIN B12: Vitamin B-12: 447 pg/mL (ref 200–1100)

## 2018-12-09 NOTE — Patient Instructions (Addendum)
Continue current medications and follow-up in 6 months.  Continue to work on diet and exercise.  Continue Prolia every 6 months for osteoporosis.  B12 level is normal.  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

## 2019-03-13 ENCOUNTER — Other Ambulatory Visit (INDEPENDENT_AMBULATORY_CARE_PROVIDER_SITE_OTHER): Payer: Medicare Other | Admitting: Internal Medicine

## 2019-03-13 ENCOUNTER — Other Ambulatory Visit: Payer: Self-pay

## 2019-03-13 DIAGNOSIS — Z0000 Encounter for general adult medical examination without abnormal findings: Secondary | ICD-10-CM | POA: Diagnosis not present

## 2019-03-13 DIAGNOSIS — M810 Age-related osteoporosis without current pathological fracture: Secondary | ICD-10-CM | POA: Diagnosis not present

## 2019-03-13 DIAGNOSIS — M792 Neuralgia and neuritis, unspecified: Secondary | ICD-10-CM | POA: Diagnosis not present

## 2019-03-13 DIAGNOSIS — I10 Essential (primary) hypertension: Secondary | ICD-10-CM | POA: Diagnosis not present

## 2019-03-13 DIAGNOSIS — G609 Hereditary and idiopathic neuropathy, unspecified: Secondary | ICD-10-CM

## 2019-03-13 DIAGNOSIS — R8290 Unspecified abnormal findings in urine: Secondary | ICD-10-CM | POA: Diagnosis not present

## 2019-03-13 DIAGNOSIS — E781 Pure hyperglyceridemia: Secondary | ICD-10-CM

## 2019-03-13 LAB — COMPLETE METABOLIC PANEL WITH GFR
AG Ratio: 2 (calc) (ref 1.0–2.5)
ALT: 11 U/L (ref 6–29)
AST: 17 U/L (ref 10–35)
Albumin: 4.5 g/dL (ref 3.6–5.1)
Alkaline phosphatase (APISO): 50 U/L (ref 37–153)
BUN: 20 mg/dL (ref 7–25)
CO2: 29 mmol/L (ref 20–32)
Calcium: 9.5 mg/dL (ref 8.6–10.4)
Chloride: 105 mmol/L (ref 98–110)
Creat: 0.61 mg/dL (ref 0.60–0.93)
GFR, Est African American: 104 mL/min/{1.73_m2} (ref >=60)
GFR, Est Non African American: 90 mL/min/{1.73_m2} (ref >=60)
Globulin: 2.2 g/dL (calc) (ref 1.9–3.7)
Glucose, Bld: 96 mg/dL (ref 65–99)
Potassium: 4 mmol/L (ref 3.5–5.3)
Sodium: 140 mmol/L (ref 135–146)
Total Bilirubin: 0.5 mg/dL (ref 0.2–1.2)
Total Protein: 6.7 g/dL (ref 6.1–8.1)

## 2019-03-13 LAB — CBC WITH DIFFERENTIAL/PLATELET
Absolute Monocytes: 502 cells/uL (ref 200–950)
Basophils Absolute: 33 cells/uL (ref 0–200)
Basophils Relative: 0.5 %
Eosinophils Absolute: 73 cells/uL (ref 15–500)
Eosinophils Relative: 1.1 %
HCT: 39.3 % (ref 35.0–45.0)
Hemoglobin: 13.2 g/dL (ref 11.7–15.5)
Lymphs Abs: 1934 cells/uL (ref 850–3900)
MCH: 29.2 pg (ref 27.0–33.0)
MCHC: 33.6 g/dL (ref 32.0–36.0)
MCV: 86.9 fL (ref 80.0–100.0)
MPV: 11 fL (ref 7.5–12.5)
Monocytes Relative: 7.6 %
Neutro Abs: 4059 cells/uL (ref 1500–7800)
Neutrophils Relative %: 61.5 %
Platelets: 249 10*3/uL (ref 140–400)
RBC: 4.52 10*6/uL (ref 3.80–5.10)
RDW: 12.2 % (ref 11.0–15.0)
Total Lymphocyte: 29.3 %
WBC: 6.6 10*3/uL (ref 3.8–10.8)

## 2019-03-13 LAB — POCT URINALYSIS DIPSTICK
Bilirubin, UA: NEGATIVE
Glucose, UA: NEGATIVE
Ketones, UA: NEGATIVE
Nitrite, UA: POSITIVE
Protein, UA: NEGATIVE
Spec Grav, UA: 1.015 (ref 1.010–1.025)
Urobilinogen, UA: 0.2 E.U./dL
pH, UA: 6 (ref 5.0–8.0)

## 2019-03-13 LAB — LIPID PANEL
Cholesterol: 158 mg/dL (ref <200)
HDL: 56 mg/dL (ref >=50)
LDL Cholesterol (Calc): 78 mg/dL (calc)
Non-HDL Cholesterol (Calc): 102 mg/dL (calc) (ref <130)
Total CHOL/HDL Ratio: 2.8 (calc) (ref <5.0)
Triglycerides: 137 mg/dL (ref <150)

## 2019-03-13 LAB — TSH: TSH: 2 mIU/L (ref 0.40–4.50)

## 2019-03-13 NOTE — Progress Notes (Signed)
Lab only 

## 2019-03-13 NOTE — Addendum Note (Signed)
Addended by: Gregery Na on: 03/13/2019 04:51 PM   Modules accepted: Orders

## 2019-03-14 ENCOUNTER — Encounter: Payer: Medicare Other | Admitting: Internal Medicine

## 2019-03-15 LAB — URINE CULTURE
MICRO NUMBER:: 442758
SPECIMEN QUALITY:: ADEQUATE

## 2019-03-16 ENCOUNTER — Other Ambulatory Visit: Payer: Self-pay

## 2019-03-16 ENCOUNTER — Ambulatory Visit: Payer: Medicare Other | Admitting: Internal Medicine

## 2019-03-16 VITALS — BP 150/80 | HR 88 | Temp 98.3°F | Wt 166.0 lb

## 2019-03-16 DIAGNOSIS — M810 Age-related osteoporosis without current pathological fracture: Secondary | ICD-10-CM | POA: Diagnosis not present

## 2019-03-16 DIAGNOSIS — R8290 Unspecified abnormal findings in urine: Secondary | ICD-10-CM | POA: Diagnosis not present

## 2019-03-16 DIAGNOSIS — E781 Pure hyperglyceridemia: Secondary | ICD-10-CM | POA: Diagnosis not present

## 2019-03-16 DIAGNOSIS — N390 Urinary tract infection, site not specified: Secondary | ICD-10-CM

## 2019-03-16 DIAGNOSIS — Z0000 Encounter for general adult medical examination without abnormal findings: Secondary | ICD-10-CM

## 2019-03-16 DIAGNOSIS — I10 Essential (primary) hypertension: Secondary | ICD-10-CM

## 2019-03-16 DIAGNOSIS — B9620 Unspecified Escherichia coli [E. coli] as the cause of diseases classified elsewhere: Secondary | ICD-10-CM

## 2019-03-16 MED ORDER — CIPROFLOXACIN HCL 500 MG PO TABS: 500.0000 mg | ORAL_TABLET | Freq: Two times a day (BID) | ORAL | 0 refills | Status: DC

## 2019-03-16 NOTE — Progress Notes (Signed)
Subjective:    Patient ID: Linda Sosa, female    DOB: November 04, 1946, 73 y.o.   MRN: [id number]  HPI 73 year old Female for Medicare wellness, Health maintenance exam and evaluation of medical issues.  Has been doing a lot of gardening.  Is not watching diet as much as she should.  Says blood pressure is stable on current regimen at home.  Has an element of office hypertension.  History of hypertriglyceridemia.  Is on Zocor.  Lipid panel was normal.  Took Fosamax for a long time for age-related osteoporosis.  Have bone density study in the near future.  She is on Prolia every 6 months.  Followed by Dr. Lucianne Muss.  Has abnormal urine dipstick.  Is asymptomatic.  Will be treated with Cipro.  Follow-up in office after course of Cipro completed.  History of vitamin D deficiency but currently takes vitamin D supplementation.  History of microcytic anemia 2014.  Anemia improved with iron supplementation.  She had colonoscopy February 2014 which was her first colonoscopy which was normal with the exception of diverticulosis.  She has had no further recurrence of anemia.  Past medical history: Cholecystectomy 1997.  Was found to be vitamin D deficient in 2010.  Last Pap smear 2014 was normal.  Social history: Does not smoke or consume alcohol.  He is married.  Husband has history of non-Hodgkin's lymphoma.  Patient does not work outside the home.  She completed 10 years of college.  Family history: Father died at 13 with heart problems and history of diabetes.  Mother died at age 66 of ovarian cancer.  One brother died with heart problems.  2 sisters in fairly good health but one is a diabetic.  One daughter in good health.  Review of Systems  Constitutional: Negative.   All other systems reviewed and are negative.  no new complaints     Objective:   Physical Exam Vitals signs reviewed.  Constitutional:      General: She is not in acute distress.    Appearance: Normal appearance. She  is obese. She is not ill-appearing or diaphoretic.  HENT:     Head: Normocephalic and atraumatic.     Right Ear: Tympanic membrane normal.     Left Ear: Tympanic membrane normal.     Nose: Nose normal.     Mouth/Throat:     Mouth: Mucous membranes are moist.     Pharynx: Oropharynx is clear.  Eyes:     General: No scleral icterus.       Right eye: No discharge.        Left eye: No discharge.     Extraocular Movements: Extraocular movements intact.     Conjunctiva/sclera: Conjunctivae normal.     Pupils: Pupils are equal, round, and reactive to light.  Neck:     Musculoskeletal: Neck supple.     Vascular: No carotid bruit.     Comments: No thyromegaly Cardiovascular:     Rate and Rhythm: Normal rate and regular rhythm.     Pulses: Normal pulses.     Heart sounds: Normal heart sounds. No murmur. No gallop.   Pulmonary:     Effort: Pulmonary effort is normal.     Breath sounds: Normal breath sounds.     Comments: Breast without masses Abdominal:     General: There is no distension.     Palpations: Abdomen is soft. There is no mass.     Tenderness: There is no abdominal tenderness. There is  no right CVA tenderness, left CVA tenderness or guarding.  Genitourinary:    Comments: Pap deferred due to age.  Bimanual normal without masses Musculoskeletal:     Right lower leg: No edema.     Left lower leg: No edema.  Lymphadenopathy:     Cervical: No cervical adenopathy.  Skin:    General: Skin is warm and dry.     Findings: No lesion or rash.  Neurological:     General: No focal deficit present.     Mental Status: She is alert and oriented to person, place, and time.     Cranial Nerves: No cranial nerve deficit.     Sensory: No sensory deficit.     Motor: No weakness.     Coordination: Coordination normal.     Gait: Gait normal.     Deep Tendon Reflexes: Reflexes normal.  Psychiatric:        Mood and Affect: Mood normal.        Behavior: Behavior normal.        Thought  Content: Thought content normal.        Judgment: Judgment normal.           Assessment & Plan:  Abnormal urinalysis-urine culture ordered.  Has E. coli UTI to be treated with Cipro for 7 days with follow-up urinalysis in the office after Cipro course is completed  Essential hypertension-has element of office hypertension.  Blood pressure at home is much better.  Continue current medications  BMI 32- continue to work on diet exercise.  TSH is normal.  Osteoporosis -age-related.  Have bone density study.  Currently just takes calcium.  Hypertriglyceridemia-continue statin medication.  Triglycerides elevated at 174.  Watch fat in diet.  Liver functions are normal on statin.  Subjective:   Patient presents for Medicare Annual/Subsequent preventive examination.  Review Past Medical/Family/Social: See above   Risk Factors  Current exercise habits: Exercise consisting of gardening Dietary issues discussed: Low-fat low carbohydrate  Cardiac risk factors: Hypertriglyceridemia, family history and brother  Depression Screen  (Note: if answer to either of the following is "Yes", a more complete depression screening is indicated)   Over the past two weeks, have you felt down, depressed or hopeless? No  Over the past two weeks, have you felt little interest or pleasure in doing things? No Have you lost interest or pleasure in daily life? No Do you often feel hopeless? No Do you cry easily over simple problems? No   Activities of Daily Living  In your present state of health, do you have any difficulty performing the following activities?:   Driving? No  Managing money? No  Feeding yourself? No  Getting from bed to chair? No  Climbing a flight of stairs? No  Preparing food and eating?: No  Bathing or showering? No  Getting dressed: No  Getting to the toilet? No  Using the toilet:No  Moving around from place to place: No  In the past year have you fallen or had a near fall?:No   Are you sexually active? No  Do you have more than one partner? No   Hearing Difficulties: No  Do you often ask people to speak up or repeat themselves? No  Do you experience ringing or noises in your ears? No  Do you have difficulty understanding soft or whispered voices? No  Do you feel that you have a problem with memory? No Do you often misplace items? No    Home Safety:  Do  you have a smoke alarm at your residence? Yes Do you have grab bars in the bathroom?  Yes Do you have throw rugs in your house?  None  Cognitive Testing  Alert? Yes Normal Appearance?Yes  Oriented to person? Yes Place? Yes  Time? Yes  Recall of three objects? Yes  Can perform simple calculations? Yes  Displays appropriate judgment?Yes  Can read the correct time from a watch face?Yes   List the Names of Other Physician/Practitioners you currently use:  See referral list for the physicians patient is currently seeing.  Dr. Lucianne Muss regarding osteoporosis   Review of Systems: See above   Objective:     General appearance: Appears stated age and mildly obese  Head: Normocephalic, without obvious abnormality, atraumatic  Eyes: conj clear, EOMi PEERLA  Ears: normal TM's and external ear canals both ears  Nose: Nares normal. Septum midline. Mucosa normal. No drainage or sinus tenderness.  Throat: lips, mucosa, and tongue normal; teeth and gums normal  Neck: no adenopathy, no carotid bruit, no JVD, supple, symmetrical, trachea midline and thyroid not enlarged, symmetric, no tenderness/mass/nodules  No CVA tenderness.  Lungs: clear to auscultation bilaterally  Breasts: normal appearance, no masses or tenderness Heart: regular rate and rhythm, S1, S2 normal, no murmur, click, rub or gallop  Abdomen: soft, non-tender; bowel sounds normal; no masses, no organomegaly  Musculoskeletal: ROM normal in all joints, no crepitus, no deformity, Normal muscle strengthen. Back  is symmetric, no curvature. Skin: Skin  color, texture, turgor normal. No rashes or lesions  Lymph nodes: Cervical, supraclavicular, and axillary nodes normal.  Neurologic: CN 2 -12 Normal, Normal symmetric reflexes. Normal coordination and gait  Psych: Alert & Oriented x 3, Mood appear stable.    Assessment:    Annual wellness medicare exam   Plan:    During the course of the visit the patient was educated and counseled about appropriate screening and preventive services including:   Annual mammogram  Patient had bone density study  Colonoscopy up-to-date  Recommend annual flu vaccine  Pneumonia vaccines up-to-date  Tdap is up-to-date  Had one zoster vaccine in 2013.  To consider Shingrix     Patient Instructions (the written plan) was given to the patient.  Medicare Attestation  I have personally reviewed:  The patient's medical and social history  Their use of alcohol, tobacco or illicit drugs  Their current medications and supplements  The patient's functional ability including ADLs,fall risks, home safety risks, cognitive, and hearing and visual impairment  Diet and physical activities  Evidence for depression or mood disorders  The patient's weight, height, BMI, and visual acuity have been recorded in the chart. I have made referrals, counseling, and provided education to the patient based on review of the above and I have provided the patient with a written personalized care plan for preventive services.

## 2019-03-29 ENCOUNTER — Other Ambulatory Visit: Payer: Self-pay | Admitting: Internal Medicine

## 2019-03-29 DIAGNOSIS — Z1231 Encounter for screening mammogram for malignant neoplasm of breast: Secondary | ICD-10-CM

## 2019-03-30 ENCOUNTER — Other Ambulatory Visit: Payer: Self-pay

## 2019-03-30 ENCOUNTER — Ambulatory Visit: Payer: Medicare Other | Admitting: Internal Medicine

## 2019-03-30 VITALS — BP 150/80 | HR 96

## 2019-03-30 DIAGNOSIS — I10 Essential (primary) hypertension: Secondary | ICD-10-CM | POA: Diagnosis not present

## 2019-03-30 DIAGNOSIS — N390 Urinary tract infection, site not specified: Secondary | ICD-10-CM | POA: Diagnosis not present

## 2019-03-30 DIAGNOSIS — B9620 Unspecified Escherichia coli [E. coli] as the cause of diseases classified elsewhere: Secondary | ICD-10-CM

## 2019-03-30 LAB — POCT URINALYSIS DIPSTICK
Appearance: NORMAL
Bilirubin, UA: NEGATIVE
Blood, UA: NEGATIVE
Glucose, UA: NEGATIVE
Ketones, UA: NEGATIVE
Leukocytes, UA: NEGATIVE
Nitrite, UA: NEGATIVE
Odor: NORMAL
Protein, UA: NEGATIVE
Spec Grav, UA: 1.01 (ref 1.010–1.025)
Urobilinogen, UA: 0.2 E.U./dL
pH, UA: 6.5 (ref 5.0–8.0)

## 2019-04-03 ENCOUNTER — Encounter: Payer: Self-pay | Admitting: Internal Medicine

## 2019-04-03 NOTE — Progress Notes (Signed)
   Subjective:    Patient ID: Linda Sosa, female    DOB: November 18, 1945, 73 y.o.   MRN: [id number]  HPI 73 year old Female in today to follow-up on elevated blood pressure with history of essential hypertension and recent UTI treated with Cipro.  At visit on May 7, she was here for physical examination.  Urine dipstick was abnormal.  Culture grew E. coli greater than 100,000 colonies per milliliter.  She was treated with Cipro 500 mg twice daily for 7 days and is now here for recheck.  Tends to have office hypertension.  Blood pressure readings at home have been much better than 150/80 she says.    Review of Systems no new complaints     Objective:   Physical Exam  Blood pressure 150/80.  Urine dipstick now is normal.      Assessment & Plan:  Office hypertension  Status post treatment for E. coli UTI with urine is now clear  Plan: Follow-up in 6 months and continue to monitor blood pressure at home

## 2019-04-03 NOTE — Patient Instructions (Addendum)
Urinary tract infection has resolved.  Continue to monitor blood pressure at home.  Patient has office hypertension.  Continue same medications.  Follow-up in 6 months.

## 2019-04-08 ENCOUNTER — Encounter: Payer: Self-pay | Admitting: Internal Medicine

## 2019-04-08 NOTE — Patient Instructions (Addendum)
Urinary tract infection being treated with Cipro.  Follow-up after Cipro course is completed.  It was a pleasure to see you today.  Continue current medications and follow-up in 6 months.  Have bone density study.  Continue to monitor blood pressure at home.  Call if it is elevated persistently.

## 2019-05-29 ENCOUNTER — Other Ambulatory Visit: Payer: Self-pay | Admitting: Internal Medicine

## 2019-07-03 ENCOUNTER — Other Ambulatory Visit: Payer: Self-pay

## 2019-07-03 ENCOUNTER — Ambulatory Visit
Admission: RE | Admit: 2019-07-03 | Discharge: 2019-07-03 | Disposition: A | Discharge status: Home / Self Care | Payer: Medicare Other | Source: Ambulatory Visit | Attending: Internal Medicine | Admitting: Internal Medicine

## 2019-07-03 DIAGNOSIS — Z780 Asymptomatic menopausal state: Secondary | ICD-10-CM | POA: Diagnosis not present

## 2019-07-03 DIAGNOSIS — Z1231 Encounter for screening mammogram for malignant neoplasm of breast: Secondary | ICD-10-CM

## 2019-07-03 DIAGNOSIS — M810 Age-related osteoporosis without current pathological fracture: Secondary | ICD-10-CM | POA: Diagnosis not present

## 2019-09-15 ENCOUNTER — Other Ambulatory Visit: Payer: Self-pay

## 2019-09-15 ENCOUNTER — Other Ambulatory Visit: Payer: Medicare Other | Admitting: Internal Medicine

## 2019-09-15 DIAGNOSIS — I10 Essential (primary) hypertension: Secondary | ICD-10-CM

## 2019-09-15 DIAGNOSIS — E781 Pure hyperglyceridemia: Secondary | ICD-10-CM

## 2019-09-15 LAB — LIPID PANEL
Cholesterol: 168 mg/dL
HDL: 54 mg/dL
LDL Cholesterol (Calc): 86 mg/dL
Non-HDL Cholesterol (Calc): 114 mg/dL
Total CHOL/HDL Ratio: 3.1 (calc)
Triglycerides: 188 mg/dL — ABNORMAL HIGH

## 2019-09-15 LAB — HEPATIC FUNCTION PANEL
AG Ratio: 2 (calc) (ref 1.0–2.5)
ALT: 10 U/L (ref 6–29)
AST: 15 U/L (ref 10–35)
Albumin: 4.3 g/dL (ref 3.6–5.1)
Alkaline phosphatase (APISO): 57 U/L (ref 37–153)
Bilirubin, Direct: 0.1 mg/dL (ref 0.0–0.2)
Globulin: 2.2 g/dL (ref 1.9–3.7)
Indirect Bilirubin: 0.4 mg/dL (ref 0.2–1.2)
Total Bilirubin: 0.5 mg/dL (ref 0.2–1.2)
Total Protein: 6.5 g/dL (ref 6.1–8.1)

## 2019-09-15 LAB — BASIC METABOLIC PANEL
BUN: 16 mg/dL (ref 7–25)
CO2: 27 mmol/L (ref 20–32)
Calcium: 9.7 mg/dL (ref 8.6–10.4)
Chloride: 102 mmol/L (ref 98–110)
Creat: 0.64 mg/dL (ref 0.60–0.93)
Glucose, Bld: 97 mg/dL (ref 65–99)
Potassium: 4.2 mmol/L (ref 3.5–5.3)
Sodium: 139 mmol/L (ref 135–146)

## 2019-09-18 ENCOUNTER — Other Ambulatory Visit: Payer: Self-pay

## 2019-09-18 ENCOUNTER — Ambulatory Visit: Payer: Medicare Other | Admitting: Internal Medicine

## 2019-09-18 ENCOUNTER — Encounter: Payer: Self-pay | Admitting: Internal Medicine

## 2019-09-18 VITALS — BP 140/70 | HR 102 | Temp 98.0°F | Ht 60.25 in | Wt 165.0 lb

## 2019-09-18 DIAGNOSIS — E781 Pure hyperglyceridemia: Secondary | ICD-10-CM | POA: Diagnosis not present

## 2019-09-18 DIAGNOSIS — I10 Essential (primary) hypertension: Secondary | ICD-10-CM

## 2019-09-18 DIAGNOSIS — Z23 Encounter for immunization: Secondary | ICD-10-CM | POA: Diagnosis not present

## 2019-09-18 NOTE — Progress Notes (Signed)
   Subjective:    Patient ID: Linda Sosa, female    DOB: March 03, 1946, 73 y.o.   MRN: [id number]  HPI   73 year old Female for 6 month recheck. Doing well during the pandemic  BP ranging in 264B systolically at home.Hx white coat syndrome with elevated BP; however, BP 140/70 here with pulse 102. Brings in multiple blood pressure readings from home which are very acceptable.  Triglycerides elevated at 188 and were normal 6 months ago. Liver functions are normal and B-met normal. Will continue Zocor and watch diet. No change in dose.  Flu vaccine given today.  Patient on Zocor 20 mg daily. Takes losartan 100 mg daily for losartan.  Hx of osteoporosis and is followed by Dr. Dwyane Dee and treated with Prolia. Takes Vitamin D supplement.  Had E.coli UTI in May.  Review of Systems no new complaints     Objective:   Physical Exam BP 140/80 pulse 102 weight 165 pounds. BMI 31.96  Skin: warm and dry. Neck is supple without JVD, thyromegaly or carotid bruits. Chest is clear to auscultation.Cor:RRR, Ext: without edema.       Assessment & Plan:   HTN-labile in office but stable at home. Has element of office hypertension.  Hyperlipidemia- Triglycerides were normal in Spring on Zocor. Watch diet. No change in dose. Usually gets exercise by gardening.  Health maintenance: Flu vaccine given today.  Osteoporosis-continue Prolia and oral Vitamin D per Dr. Dwyane Dee.  RTC for CPE and Medicare Wellness Visit in May. Watch diet and get regular exercise. No change in meds. Flu vaccine given.

## 2019-09-18 NOTE — Patient Instructions (Signed)
Continue to monitor BP at home. Flu vaccine given Continue same meds and watch diet. RTC May 2021.

## 2019-11-26 ENCOUNTER — Other Ambulatory Visit: Payer: Self-pay | Admitting: Internal Medicine

## 2020-02-22 ENCOUNTER — Ambulatory Visit: Payer: Medicare Other | Attending: Internal Medicine

## 2020-02-22 DIAGNOSIS — Z23 Encounter for immunization: Secondary | ICD-10-CM

## 2020-02-22 NOTE — Progress Notes (Signed)
   Covid-19 Vaccination Clinic  Name:  Linda Sosa    MRN: [id number] DOB: 11-22-1945  02/22/2020  [name] was observed post Covid-19 immunization for 15 minutes without incident. She was provided with Vaccine Information Sheet and instruction to access the V-Safe system.   [name] was instructed to call 911 with any severe reactions post vaccine: Marland Kitchen Difficulty breathing  . Swelling of face and throat  . A fast heartbeat  . A bad rash all over body  . Dizziness and weakness   Immunizations Administered    Name Date Dose VIS Date Route   Pfizer COVID-19 Vaccine 02/22/2020 10:59 AM 0.3 mL 10/20/2019 Intramuscular   Manufacturer: [employer], [employer]   Lot: [id number]   NDC: [address]-4

## 2020-02-28 ENCOUNTER — Other Ambulatory Visit: Payer: Self-pay | Admitting: Internal Medicine

## 2020-03-15 ENCOUNTER — Other Ambulatory Visit: Payer: Medicare Other | Admitting: Internal Medicine

## 2020-03-15 ENCOUNTER — Other Ambulatory Visit: Payer: Self-pay

## 2020-03-15 DIAGNOSIS — E7800 Pure hypercholesterolemia, unspecified: Secondary | ICD-10-CM

## 2020-03-15 DIAGNOSIS — G609 Hereditary and idiopathic neuropathy, unspecified: Secondary | ICD-10-CM

## 2020-03-15 DIAGNOSIS — I10 Essential (primary) hypertension: Secondary | ICD-10-CM

## 2020-03-15 DIAGNOSIS — E781 Pure hyperglyceridemia: Secondary | ICD-10-CM

## 2020-03-15 DIAGNOSIS — Z0000 Encounter for general adult medical examination without abnormal findings: Secondary | ICD-10-CM

## 2020-03-15 DIAGNOSIS — M818 Other osteoporosis without current pathological fracture: Secondary | ICD-10-CM

## 2020-03-15 DIAGNOSIS — M792 Neuralgia and neuritis, unspecified: Secondary | ICD-10-CM

## 2020-03-16 LAB — LIPID PANEL
Cholesterol: 166 mg/dL (ref <200)
HDL: 65 mg/dL (ref >=50)
LDL Cholesterol (Calc): 77 mg/dL (calc)
Non-HDL Cholesterol (Calc): 101 mg/dL (calc) (ref <130)
Total CHOL/HDL Ratio: 2.6 (calc) (ref <5.0)
Triglycerides: 144 mg/dL (ref <150)

## 2020-03-16 LAB — COMPLETE METABOLIC PANEL WITH GFR
AG Ratio: 2.2 (calc) (ref 1.0–2.5)
ALT: 12 U/L (ref 6–29)
AST: 16 U/L (ref 10–35)
Albumin: 4.3 g/dL (ref 3.6–5.1)
Alkaline phosphatase (APISO): 67 U/L (ref 37–153)
BUN: 19 mg/dL (ref 7–25)
CO2: 27 mmol/L (ref 20–32)
Calcium: 9.8 mg/dL (ref 8.6–10.4)
Chloride: 106 mmol/L (ref 98–110)
Creat: 0.65 mg/dL (ref 0.60–0.93)
GFR, Est African American: 101 mL/min/{1.73_m2} (ref >=60)
GFR, Est Non African American: 87 mL/min/{1.73_m2} (ref >=60)
Globulin: 2 g/dL (calc) (ref 1.9–3.7)
Glucose, Bld: 92 mg/dL (ref 65–99)
Potassium: 4.3 mmol/L (ref 3.5–5.3)
Sodium: 140 mmol/L (ref 135–146)
Total Bilirubin: 0.6 mg/dL (ref 0.2–1.2)
Total Protein: 6.3 g/dL (ref 6.1–8.1)

## 2020-03-16 LAB — CBC WITH DIFFERENTIAL/PLATELET
Absolute Monocytes: 506 cells/uL (ref 200–950)
Basophils Absolute: 31 cells/uL (ref 0–200)
Basophils Relative: 0.5 %
Eosinophils Absolute: 79 cells/uL (ref 15–500)
Eosinophils Relative: 1.3 %
HCT: 40.1 % (ref 35.0–45.0)
Hemoglobin: 13 g/dL (ref 11.7–15.5)
Lymphs Abs: 1873 cells/uL (ref 850–3900)
MCH: 28.4 pg (ref 27.0–33.0)
MCHC: 32.4 g/dL (ref 32.0–36.0)
MCV: 87.7 fL (ref 80.0–100.0)
MPV: 10.7 fL (ref 7.5–12.5)
Monocytes Relative: 8.3 %
Neutro Abs: 3611 cells/uL (ref 1500–7800)
Neutrophils Relative %: 59.2 %
Platelets: 253 10*3/uL (ref 140–400)
RBC: 4.57 10*6/uL (ref 3.80–5.10)
RDW: 11.9 % (ref 11.0–15.0)
Total Lymphocyte: 30.7 %
WBC: 6.1 10*3/uL (ref 3.8–10.8)

## 2020-03-16 LAB — TSH: TSH: 1.57 mIU/L (ref 0.40–4.50)

## 2020-03-18 ENCOUNTER — Encounter: Payer: Medicare Other | Admitting: Internal Medicine

## 2020-03-18 ENCOUNTER — Ambulatory Visit: Payer: Medicare Other | Attending: Internal Medicine

## 2020-03-18 DIAGNOSIS — Z23 Encounter for immunization: Secondary | ICD-10-CM

## 2020-03-18 NOTE — Progress Notes (Signed)
   Covid-19 Vaccination Clinic  Name:  Linda Sosa    MRN: [id number] DOB: February 09, 1946  03/18/2020  [name] was observed post Covid-19 immunization for 15 minutes without incident. She was provided with Vaccine Information Sheet and instruction to access the V-Safe system.   [name] was instructed to call 911 with any severe reactions post vaccine: Marland Kitchen Difficulty breathing  . Swelling of face and throat  . A fast heartbeat  . A bad rash all over body  . Dizziness and weakness   Immunizations Administered    Name Date Dose VIS Date Route   Pfizer COVID-19 Vaccine 03/18/2020 11:10 AM 0.3 mL 01/03/2019 Intramuscular   Manufacturer: [employer], [employer]   Lot: [id number]   NDC: [address]-5

## 2020-03-20 ENCOUNTER — Other Ambulatory Visit: Payer: Self-pay | Admitting: Internal Medicine

## 2020-03-21 ENCOUNTER — Ambulatory Visit (INDEPENDENT_AMBULATORY_CARE_PROVIDER_SITE_OTHER): Payer: Medicare Other | Admitting: Internal Medicine

## 2020-03-21 ENCOUNTER — Encounter: Payer: Self-pay | Admitting: Internal Medicine

## 2020-03-21 VITALS — BP 150/90 | HR 106 | Temp 98.6°F | Ht 60.25 in | Wt 163.0 lb

## 2020-03-21 DIAGNOSIS — M818 Other osteoporosis without current pathological fracture: Secondary | ICD-10-CM

## 2020-03-21 DIAGNOSIS — Z79899 Other long term (current) drug therapy: Secondary | ICD-10-CM | POA: Diagnosis not present

## 2020-03-21 DIAGNOSIS — I10 Essential (primary) hypertension: Secondary | ICD-10-CM

## 2020-03-21 DIAGNOSIS — Z5181 Encounter for therapeutic drug level monitoring: Secondary | ICD-10-CM | POA: Diagnosis not present

## 2020-03-21 DIAGNOSIS — Z0000 Encounter for general adult medical examination without abnormal findings: Secondary | ICD-10-CM

## 2020-03-21 DIAGNOSIS — E781 Pure hyperglyceridemia: Secondary | ICD-10-CM

## 2020-03-21 LAB — POCT URINALYSIS DIPSTICK
Appearance: NEGATIVE
Bilirubin, UA: NEGATIVE
Blood, UA: NEGATIVE
Glucose, UA: NEGATIVE
Ketones, UA: NEGATIVE
Leukocytes, UA: NEGATIVE
Nitrite, UA: NEGATIVE
Odor: NEGATIVE
Protein, UA: NEGATIVE
Spec Grav, UA: 1.01 (ref 1.010–1.025)
Urobilinogen, UA: 0.2 E.U./dL
pH, UA: 6 (ref 5.0–8.0)

## 2020-03-21 MED ORDER — SIMVASTATIN 20 MG PO TABS: ORAL_TABLET | ORAL | 1 refills | Status: DC

## 2020-03-21 NOTE — Progress Notes (Signed)
Subjective:    Patient ID: Linda Sosa, female    DOB: May 29, 1946, 74 y.o.   MRN: [id number]  HPI 74 year old Female in today for Medicare wellness, health maintenance exam and evaluation of medical issues.  No new complaints.  History of hypertension and whitecoat syndrome.  Blood pressure stable at home.  She takes blood pressure regularly.  Continue losartan 100 mg daily.  History of osteoporosis with T score on bone density -2.17 June 2019.  Treated by Dr. Lucianne Muss with Prolia.  Takes vitamin D supplement.  She took Fosamax for a long time for age-related osteoporosis.  History of hyperlipidemia treated with statin medication.  Lipid panel is entirely normal.  Continue Zocor 20 mg daily.  Colonoscopy 2014 with 10 year follow up suggested by Dr. Jarold Motto who has retired.  History of E. coli UTI May 2020.  Urine dipstick today is normal.  Mammogram was done August 2020.  No change in Family history: Father died at age 70 with heart problems with history of diabetes.  Mother died at age 48 of ovarian cancer.  1 brother died with heart problems.  2 sisters in fairly good health but 1 is a diabetic.  1 daughter in good health.  Social history: Does not smoke or consume alcohol.  She is married.  Husband has a history of non-Hodgkin's lymphoma.  Patient does not work outside the home.  She completed 2 years of college.  Past medical history: Cholecystectomy 1997.  Was found to be vitamin D deficient in 2010.  Zocor was started in 2011.  She developed microcytic anemia in 2014.  Anemia improved with iron supplementation and she had colonoscopy February 2014 which was her first colonoscopy and was done by Dr. Jarold Motto.  Colonoscopy was normal except for diverticulosis.  Has had no further recurrence of anemia.  Has not wanted to have another colonoscopy.  Would be a candidate for Cologuard. Review of Systems  Respiratory: Negative.   Cardiovascular: Negative.   Gastrointestinal:  Negative.   Genitourinary: Negative.   Neurological: Negative.   Psychiatric/Behavioral: Negative.        Objective:   Physical Exam Vitals reviewed.  Constitutional:      General: She is not in acute distress.    Appearance: Normal appearance.  HENT:     Head: Normocephalic and atraumatic.     Right Ear: Tympanic membrane normal.     Left Ear: Tympanic membrane normal.     Nose: Nose normal.  Eyes:     Extraocular Movements: Extraocular movements intact.     Pupils: Pupils are equal, round, and reactive to light.  Neck:     Comments: Breast without masses.  No thyromegaly. Cardiovascular:     Rate and Rhythm: Normal rate and regular rhythm.     Heart sounds: Normal heart sounds. No murmur.     Comments: No carotid bruits. Pulmonary:     Effort: Pulmonary effort is normal.     Breath sounds: Normal breath sounds.  Abdominal:     General: Bowel sounds are normal.     Palpations: Abdomen is soft. There is no mass.     Tenderness: There is no abdominal tenderness. There is no rebound.  Musculoskeletal:     Cervical back: Neck supple. No rigidity.     Right lower leg: No edema.     Left lower leg: No edema.  Skin:    General: Skin is warm and dry.     Findings: No rash.  Neurological:     General: No focal deficit present.     Mental Status: She is alert and oriented to person, place, and time.     Cranial Nerves: No cranial nerve deficit.     Motor: No weakness.     Coordination: Coordination normal.     Gait: Gait normal.     Deep Tendon Reflexes: Reflexes normal.  Psychiatric:        Mood and Affect: Mood normal.        Behavior: Behavior normal.        Thought Content: Thought content normal.        Judgment: Judgment normal.           Assessment & Plan:  Essential hypertension-blood pressure is stable at home  Whitecoat syndrome-blood pressure is elevated in office and was taken several times today.  Continue current antihypertensive medications and  monitor blood pressure at home.  This is a longstanding issue for her.  Osteoporosis.  Treated with Prolia by Dr. Lucianne Muss  Hyperlipidemia-stable on Zocor 20 mg daily  Health maintenance-would be a candidate for Cologuard  Plan: Follow-up in 6 months  Subjective:   Patient presents for Medicare Annual/Subsequent preventive examination.  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 No  Over the past two weeks, have you felt little interest or pleasure in doing things? No Have you lost interest or pleasure in daily life? No Do you often feel hopeless? No Do you cry easily over simple problems? No   Activities of Daily Living  In your present state of health, do you have any difficulty performing the following activities?:   Driving? No  Managing money? No  Feeding yourself? No  Getting from bed to chair? No  Climbing a flight of stairs? No  Preparing food and eating?: No  Bathing or showering? No  Getting dressed: No  Getting to the toilet? No  Using the toilet:No  Moving around from place to place: No  In the past year have you fallen or had a near fall?:No  Are you sexually active? No  Do you have more than one partner? No   Hearing Difficulties: No  Do you often ask people to speak up or repeat themselves? No  Do you experience ringing or noises in your ears? No  Do you have difficulty understanding soft or whispered voices? No  Do you feel that you have a problem with memory? No Do you often misplace items? No    Home Safety:  Do you have a smoke alarm at your  residence? Yes Do you have grab bars in the bathroom?  Yes Do you have throw rugs in your house?  None   Cognitive Testing  Alert? Yes Normal Appearance?Yes  Oriented to person? Yes Place? Yes  Time? Yes  Recall of three objects? Yes  Can perform simple calculations? Yes  Displays appropriate judgment?Yes  Can read the correct time from a watch face?Yes   List the Names of Other Physician/Practitioners you currently use:  See referral list for the physicians patient is currently seeing.     Review of  Systems: See above   Objective:     General appearance: Appears stated age in no acute distress Head: Normocephalic, without obvious abnormality, atraumatic  Eyes: conj clear, EOMi PEERLA  Ears: normal TM's and external ear canals both ears  Nose: Nares normal. Septum midline. Mucosa normal. No drainage or sinus tenderness.  Throat: lips, mucosa, and tongue normal; teeth and gums normal  Neck: no adenopathy, no carotid bruit, no JVD, supple, symmetrical, trachea midline and thyroid not enlarged, symmetric, no tenderness/mass/nodules  No CVA tenderness.  Lungs: clear to auscultation bilaterally  Breasts: normal appearance, no masses or tenderness Heart: regular rate and rhythm, S1, S2 normal, no murmur, click, rub or gallop  Abdomen: soft, non-tender; bowel sounds normal; no masses, no organomegaly  Musculoskeletal: ROM normal in all joints, no crepitus, no deformity, Normal muscle strengthen. Back  is symmetric, no curvature. Skin: Skin color, texture, turgor normal. No rashes or lesions  Lymph nodes: Cervical, supraclavicular, and axillary nodes normal.  Neurologic: CN 2 -12 Normal, Normal symmetric reflexes. Normal coordination and gait  Psych: Alert & Oriented x 3, Mood appear stable.    Assessment:    Annual wellness medicare exam   Plan:    During the course of the visit the patient was educated and counseled about appropriate screening and preventive services  including:   Annual mammogram  Cologuard candidate  Immunizations up-to-date except for Tdap which Medicare will not pay for unless injured.     Patient Instructions (the written plan) was given to the patient.  Medicare Attestation  I have personally reviewed:  The patient's medical and social history  Their use of alcohol, tobacco or illicit drugs  Their current medications and supplements  The patient's functional ability including ADLs,fall risks, home safety risks, cognitive, and hearing and visual impairment  Diet and physical activities  Evidence for depression or mood disorders  The patient's weight, height, BMI, and visual acuity have been recorded in the chart. I have made referrals, counseling, and provided education to the patient based on review of the above and I have provided the patient with a written personalized care plan for preventive services.

## 2020-04-07 NOTE — Patient Instructions (Signed)
It was a pleasure to see you today.  Continue to monitor blood pressure at home and let me know if persistently elevated.  You have a diagnosis of whitecoat syndrome with elevated blood pressure reading in office.  Continue current medications.  Have flu vaccine this fall.  Follow-up in 6 months.

## 2020-06-04 ENCOUNTER — Other Ambulatory Visit: Payer: Self-pay | Admitting: Internal Medicine

## 2020-06-04 DIAGNOSIS — Z1231 Encounter for screening mammogram for malignant neoplasm of breast: Secondary | ICD-10-CM

## 2020-07-04 ENCOUNTER — Ambulatory Visit
Admission: RE | Admit: 2020-07-04 | Discharge: 2020-07-04 | Disposition: A | Discharge status: Home / Self Care | Payer: Medicare Other | Source: Ambulatory Visit | Attending: Internal Medicine | Admitting: Internal Medicine

## 2020-07-04 ENCOUNTER — Other Ambulatory Visit: Payer: Self-pay

## 2020-07-04 DIAGNOSIS — Z1231 Encounter for screening mammogram for malignant neoplasm of breast: Secondary | ICD-10-CM

## 2020-09-23 ENCOUNTER — Ambulatory Visit: Payer: Medicare Other | Admitting: Internal Medicine

## 2020-09-23 ENCOUNTER — Other Ambulatory Visit: Payer: Self-pay

## 2020-09-23 ENCOUNTER — Encounter: Payer: Self-pay | Admitting: Internal Medicine

## 2020-09-23 VITALS — BP 130/70 | HR 90 | Temp 98.1°F | Ht 60.25 in | Wt 164.0 lb

## 2020-09-23 DIAGNOSIS — M818 Other osteoporosis without current pathological fracture: Secondary | ICD-10-CM | POA: Diagnosis not present

## 2020-09-23 DIAGNOSIS — I10 Essential (primary) hypertension: Secondary | ICD-10-CM

## 2020-09-23 DIAGNOSIS — Z23 Encounter for immunization: Secondary | ICD-10-CM | POA: Diagnosis not present

## 2020-09-23 DIAGNOSIS — E781 Pure hyperglyceridemia: Secondary | ICD-10-CM | POA: Diagnosis not present

## 2020-09-23 DIAGNOSIS — F439 Reaction to severe stress, unspecified: Secondary | ICD-10-CM | POA: Diagnosis not present

## 2020-09-23 NOTE — Progress Notes (Signed)
   Subjective:    Patient ID: Linda Sosa, female    DOB: 01/16/1946, 74 y.o.   MRN: [id number]  HPI  74 year old Female for 6 month recheck.    Review of Systems     Objective:   Physical Exam  BP 130/70 pulse 90 T 98.1 degrees Weight 164 pounds      Assessment & Plan:

## 2020-09-23 NOTE — Progress Notes (Signed)
   Subjective:    Patient ID: Linda Sosa, female    DOB: August 08, 1946, 74 y.o.   MRN: [id number]  HPI 74 year old Female seen for 6 month recheck.  Treated with 100 mg.  History of office hypertension.  Blood pressure is excellent today.  History of osteoporosis treated with Prolia per Dr. Dwyane Dee.  Takes vitamin D supplementation.  History of hypertriglyceridemia treated with Zocor.  Labs in May were essentially normal including TSH, lipid panel, CBC, and c-Met.  Husband has lymphoma and is hospitalized with recurrence under the care of Dr. Benay Spice.  This has been stressful for the patient.  She is due for COVID-19 booster in December.  Flu vaccine given today.    Review of Systems no new complaints     Objective:   Physical Exam  Blood pressure 130/70, pulse 90 regular, pulse oximetry 98%, weight 164 pounds BMI 31.76.  Weight is stable from visit in May.  Neck is supple without JVD thyromegaly or carotid bruits.  Chest is clear to auscultation without rales or wheezing.  Cardiac exam regular rate and rhythm normal S1 and S2 without murmurs or gallops.  No lower extremity pitting edema.      Assessment & Plan:  Situational stress with husband's illness  Essential hypertension-stable on losartan  History of hyperlipidemia-lipid panel not checked but will be checked in the spring time for Medicare annual wellness visit and health maintenance exam.  She will continue with Zocor.  Osteoporosis treated with Prolia by Dr. Dwyane Dee  Plan: She will return in May for health maintenance exam and Medicare wellness visit as well as fasting labs.  Continue current medications.

## 2020-09-23 NOTE — Patient Instructions (Signed)
Have Covid booster in the near future.  Flu vaccine given today.  It was a pleasure to see you today.  Continue current medications and follow-up with annual physical exam and Medicare wellness visit in May 2022.

## 2020-10-10 ENCOUNTER — Other Ambulatory Visit: Payer: Self-pay | Admitting: Internal Medicine

## 2020-10-22 ENCOUNTER — Other Ambulatory Visit: Payer: Self-pay | Admitting: Internal Medicine

## 2021-01-20 ENCOUNTER — Other Ambulatory Visit: Payer: Self-pay | Admitting: Internal Medicine

## 2021-01-20 NOTE — Telephone Encounter (Signed)
She will call a local pharmacy and then call back.

## 2021-01-20 NOTE — Telephone Encounter (Signed)
Please call patient. Her pharmacy is out of her medication and is suggesting we change her meds. They don't know when it will be back in stock. Does she want to change. If so we would have to chnge it and have her come for Ov in 2 weeks. If not, can she suggest another drug store so we can check and see if available.

## 2021-01-21 MED ORDER — LOSARTAN POTASSIUM 100 MG PO TABS
100.0000 mg | ORAL_TABLET | Freq: Every day | ORAL | 1 refills | Status: DC
Start: 2021-01-21 — End: 2021-08-16

## 2021-03-21 ENCOUNTER — Telehealth: Payer: Self-pay | Admitting: Internal Medicine

## 2021-03-21 ENCOUNTER — Telehealth (INDEPENDENT_AMBULATORY_CARE_PROVIDER_SITE_OTHER): Payer: Medicare Other | Admitting: Internal Medicine

## 2021-03-21 ENCOUNTER — Other Ambulatory Visit: Payer: Self-pay

## 2021-03-21 ENCOUNTER — Other Ambulatory Visit: Payer: Medicare Other | Admitting: Internal Medicine

## 2021-03-21 ENCOUNTER — Encounter: Payer: Self-pay | Admitting: Internal Medicine

## 2021-03-21 VITALS — Temp 98.6°F

## 2021-03-21 DIAGNOSIS — I10 Essential (primary) hypertension: Secondary | ICD-10-CM

## 2021-03-21 DIAGNOSIS — E781 Pure hyperglyceridemia: Secondary | ICD-10-CM

## 2021-03-21 DIAGNOSIS — B349 Viral infection, unspecified: Secondary | ICD-10-CM | POA: Diagnosis not present

## 2021-03-21 DIAGNOSIS — M818 Other osteoporosis without current pathological fracture: Secondary | ICD-10-CM

## 2021-03-21 DIAGNOSIS — F439 Reaction to severe stress, unspecified: Secondary | ICD-10-CM

## 2021-03-21 DIAGNOSIS — Z5181 Encounter for therapeutic drug level monitoring: Secondary | ICD-10-CM

## 2021-03-21 DIAGNOSIS — Z0000 Encounter for general adult medical examination without abnormal findings: Secondary | ICD-10-CM

## 2021-03-21 MED ORDER — ONDANSETRON HCL 4 MG PO TABS: 4.0000 mg | ORAL_TABLET | Freq: Three times a day (TID) | ORAL | 0 refills | Status: DC | PRN

## 2021-03-21 NOTE — Telephone Encounter (Signed)
Has she taken a home Covid test?

## 2021-03-21 NOTE — Telephone Encounter (Signed)
scheduled

## 2021-03-21 NOTE — Patient Instructions (Addendum)
Take Zofran 4 mg tablets 1 every 8 hours as needed for nausea.  Stay with clear liquids but avoid water due to ongoing nausea.  Try eating crackers or soup for the next 24 hours.  Call if symptoms worsen.  May drink Coke, ginger ale, 7-Up or sweet tea.

## 2021-03-21 NOTE — Progress Notes (Signed)
   Subjective:    Patient ID: Linda Sosa, female    DOB: 1946/11/01, 75 y.o.   MRN: [id number]  HPI 75 year old Female seen today by interactive audio and video telecommunications due to the Coronavirus pandemic.  Patient is at her home and I am in my office.  She is agreeable to visit in this format today.  She is identified using 2 identifiers as Linda Sosa. Jaster, a longstanding patient in this practice.  Mrs. Hemmer is generally seen every 6 months.  She was last seen here November 2021.  She has a history of osteoporosis treated with Prolia by Dr. Lucianne Muss.  She takes vitamin D supplementation.  History of hypertriglyceridemia treated with Zocor.  History of hypertension.  Tends to have office hypertension.  She is on losartan 100 mg daily.  Patient awakened today around 4 AM with nausea dizziness and 2 episodes of diarrhea.  She reports she has no fever.  Did not vomit.  She does not have a home COVID-19 test.  Denies headache.  No shaking chills.  Continues to feel slightly nauseated with slight weakness and unsteadiness.  No travel history.    Review of Systems see above     Objective:   Physical Exam She reports she is afebrile.  She looks a bit pale on video visit today.  She is able to give a clear concise history and answer my questions.  There is no slurring of her speech.  Her affect thought and judgment appear to be normal.       Assessment & Plan:  I feel that this could be a viral syndrome or benign positional vertigo.  I would like her to take a home COVID test if someone can bring it to her.  She needs to quarantine for couple of days.  I am calling in Zofran 4 mg tablets to take 1 tablet every 8 hours as needed for nausea.  Advised to stay away from water which she generally drinks a lot.  Stay with Coke, tea, ginger ale small amounts every 2 hours.  May eat crackers or toast.  Patient advised that I am on-call this weekend and she may call if she has any  questions or concerns.  She should advance her diet slowly.  Continue to take antihypertensive medication as prescribed.  Time spent with video visit, reviewing her medical record, medical decision making and E scribing prescription to pharmacy is 15 minutes.

## 2021-03-21 NOTE — Telephone Encounter (Signed)
Linda Sosa [phone]  Linda Sosa called to say she woke up this morning around 4:00 am with diarrhea, nausea and dizzy. She did take some medicine for the diarrhea and as long as she stays laying down and not moving she feels better. She did say on Wednesday she was bitten by a tick.

## 2021-03-24 ENCOUNTER — Encounter: Payer: Medicare Other | Admitting: Internal Medicine

## 2021-04-28 ENCOUNTER — Telehealth: Payer: Self-pay

## 2021-04-28 NOTE — Telephone Encounter (Signed)
Patient has had diarrhea off and on for a month she is requesting an office visit. No other symptoms.   [phone]

## 2021-04-28 NOTE — Telephone Encounter (Signed)
Scheduled

## 2021-05-01 ENCOUNTER — Ambulatory Visit: Payer: Medicare Other | Admitting: Internal Medicine

## 2021-05-01 ENCOUNTER — Other Ambulatory Visit: Payer: Self-pay

## 2021-05-01 ENCOUNTER — Encounter: Payer: Self-pay | Admitting: Internal Medicine

## 2021-05-01 VITALS — BP 130/80 | HR 86 | Ht 60.25 in | Wt 153.0 lb

## 2021-05-01 DIAGNOSIS — R197 Diarrhea, unspecified: Secondary | ICD-10-CM

## 2021-05-01 MED ORDER — DICYCLOMINE HCL 20 MG PO TABS: ORAL_TABLET | ORAL | 1 refills | Status: DC

## 2021-05-01 NOTE — Progress Notes (Signed)
   Subjective:    Patient ID: Linda Sosa, female    DOB: 11/05/1946, 75 y.o.   MRN: [id number]  HPI 75 year old Female whose husband died in 2020-09-23.  She is residing alone and is trying to adjust to this.  Is trying to settle her husband's estate.  About a month and a half ago she developed some diarrhea.  She says she eats differently now that she is a widow.  She has light breakfast, eats lunch and then a very small dinner.  Sometimes eats frozen Le[employer] Cuisines for dinner.  Does not really enjoy cooking anymore.  I think she is grieving the loss of her husband and is a bit depressed.  History of osteoporosis treated with Prolia by Dr. Lucianne Muss.  History of hypertriglyceridemia treated with Zocor.  History of hypertension.  Tends to have office hypertension.  Last visit here was May 13 complaining of slight nausea, slight weakness and unsteadiness.  Had awakened at 4 AM with nausea, dizziness and 2 episodes of diarrhea.  Was felt to have vertigo or a viral syndrome at that time.  Zofran was prescribed for nausea.      Review of Systems see above, denies nausea     Objective:   Physical Exam She is afebrile.  Vital signs are stable with weight 153 pounds and BMI 29.63.  A year ago she weighed 163 pounds.  In 2021/11/15she weighed 164 pounds.  Chest is clear to auscultation.  Cardiac exam regular rate and rhythm.  Abdomen is soft nondistended without hepatosplenomegaly masses or tenderness     Assessment & Plan:  I do not think this is infectious diarrhea.  I think this could be irritable bowel syndrome based on some foods that she is consuming.  She does not cook nearly as much as she used to do and eats lean cuisines.  Plan: I am prescribing dicyclomine (Bentyl) to take 1/2-hour before lunch and dinner.  She will see if diarrhea improves.  I do not think this is an infectious diarrhea process because she has no fever, chills, no bloody bowel movement in episodes or not all  that often.  I do not think it would be helpful to do stool studies at this time.  She will call me in a couple of weeks if she is not improving.  Her 13-month recheck appointment is due in 09-24-23.  She had Medicare wellness and health maintenance exam visit in May.

## 2021-05-03 NOTE — Patient Instructions (Addendum)
Take Bentyl 1/2-hour before lunch and dinner.  Call in a couple of weeks if not improving.  2-month follow-up appointment is due in November.  Had health maintenance exam, Medicare wellness visit, and lab work done in May.

## 2021-05-08 ENCOUNTER — Other Ambulatory Visit: Payer: Self-pay

## 2021-05-08 DIAGNOSIS — R197 Diarrhea, unspecified: Secondary | ICD-10-CM

## 2021-05-09 LAB — AEROBIC CULTURE

## 2021-05-14 LAB — FECAL LACTOFERRIN, QUANT
Fecal Lactoferrin: NEGATIVE
MICRO NUMBER:: 12074914
SPECIMEN QUALITY:: ADEQUATE

## 2021-05-14 LAB — OVA AND PARASITE EXAMINATION
CONCENTRATE RESULT:: NONE SEEN
MICRO NUMBER:: 12069763
SPECIMEN QUALITY:: ADEQUATE
TRICHROME RESULT:: NONE SEEN

## 2021-05-14 LAB — CLOSTRIDIUM DIFFICILE TOXIN B, QUALITATIVE, REAL-TIME PCR

## 2021-05-14 LAB — AEROBIC CULTURE

## 2021-05-24 ENCOUNTER — Other Ambulatory Visit: Payer: Self-pay | Admitting: Internal Medicine

## 2021-05-29 ENCOUNTER — Other Ambulatory Visit: Payer: Self-pay | Admitting: Internal Medicine

## 2021-06-21 ENCOUNTER — Other Ambulatory Visit: Payer: Self-pay | Admitting: Internal Medicine

## 2021-06-30 ENCOUNTER — Other Ambulatory Visit: Payer: Self-pay | Admitting: Internal Medicine

## 2021-06-30 DIAGNOSIS — Z1231 Encounter for screening mammogram for malignant neoplasm of breast: Secondary | ICD-10-CM

## 2021-07-16 ENCOUNTER — Other Ambulatory Visit: Payer: Self-pay | Admitting: Internal Medicine

## 2021-08-13 ENCOUNTER — Other Ambulatory Visit: Payer: Self-pay

## 2021-08-13 ENCOUNTER — Ambulatory Visit
Admission: RE | Admit: 2021-08-13 | Discharge: 2021-08-13 | Disposition: A | Discharge status: Home / Self Care | Payer: Medicare Other | Source: Ambulatory Visit | Attending: Internal Medicine | Admitting: Internal Medicine

## 2021-08-13 DIAGNOSIS — Z1231 Encounter for screening mammogram for malignant neoplasm of breast: Secondary | ICD-10-CM

## 2021-08-16 ENCOUNTER — Other Ambulatory Visit: Payer: Self-pay | Admitting: Internal Medicine

## 2021-10-06 ENCOUNTER — Other Ambulatory Visit: Payer: Medicare Other | Admitting: Internal Medicine

## 2021-10-06 ENCOUNTER — Other Ambulatory Visit: Payer: Self-pay

## 2021-10-06 DIAGNOSIS — I10 Essential (primary) hypertension: Secondary | ICD-10-CM

## 2021-10-06 DIAGNOSIS — E785 Hyperlipidemia, unspecified: Secondary | ICD-10-CM

## 2021-10-06 LAB — HEPATIC FUNCTION PANEL
AG Ratio: 1.8 (calc) (ref 1.0–2.5)
ALT: 11 U/L (ref 6–29)
AST: 15 U/L (ref 10–35)
Albumin: 4.3 g/dL (ref 3.6–5.1)
Alkaline phosphatase (APISO): 68 U/L (ref 37–153)
Bilirubin, Direct: 0.1 mg/dL (ref 0.0–0.2)
Globulin: 2.4 g/dL (calc) (ref 1.9–3.7)
Indirect Bilirubin: 0.4 mg/dL (calc) (ref 0.2–1.2)
Total Bilirubin: 0.5 mg/dL (ref 0.2–1.2)
Total Protein: 6.7 g/dL (ref 6.1–8.1)

## 2021-10-06 LAB — LIPID PANEL
Cholesterol: 176 mg/dL (ref <200)
HDL: 68 mg/dL (ref >=50)
LDL Cholesterol (Calc): 85 mg/dL (calc)
Non-HDL Cholesterol (Calc): 108 mg/dL (calc) (ref <130)
Total CHOL/HDL Ratio: 2.6 (calc) (ref <5.0)
Triglycerides: 129 mg/dL (ref <150)

## 2021-10-13 ENCOUNTER — Ambulatory Visit: Payer: Medicare Other | Admitting: Internal Medicine

## 2021-10-13 ENCOUNTER — Other Ambulatory Visit: Payer: Self-pay

## 2021-10-13 ENCOUNTER — Encounter: Payer: Self-pay | Admitting: Internal Medicine

## 2021-10-13 VITALS — BP 172/76 | HR 103 | Ht 60.25 in | Wt 156.0 lb

## 2021-10-13 DIAGNOSIS — Z23 Encounter for immunization: Secondary | ICD-10-CM

## 2021-10-13 DIAGNOSIS — I10 Essential (primary) hypertension: Secondary | ICD-10-CM | POA: Diagnosis not present

## 2021-10-13 DIAGNOSIS — E781 Pure hyperglyceridemia: Secondary | ICD-10-CM

## 2021-10-13 NOTE — Patient Instructions (Addendum)
Flu vaccine given. BP stable on current regimen at home. Lipid panel and liver functions are normal. RTC in 6 months. It was a pleasure to see you today.

## 2021-10-13 NOTE — Progress Notes (Addendum)
     Subjective:    Patient ID: Linda Sosa, female    DOB: 23-Oct-1946, 75 y.o.   MRN: [id number]  HPI  75 year old Female for follow up on HTN.  Currently taking Cozaar 100 mg daily, simvastatin 20 mg daily and aspirin 81 mg daily.  Takes Bentyl as needed for irritable bowel syndrome before lunch and dinner if needed.  History of osteoporosis and has seen Dr. Lucianne Muss but not since 2019.  Was receiving Prolia injections. Had colonoscopy in 2014 showing no polyps and was felt that these could be discontinued.  Had E. coli UTI in May 2020.  Has had 2 COVID immunizations in 2021.  Flu vaccine given today.  Has had pneumococcal vaccines.  Last tetanus immunization in 2011.   Review of Systems feels well with no complaints     Objective:   Physical Exam Blood pressure is elevated today at 172/76 pulse 103.  She is anxious.  Brings in multiple blood pressure readings from home which are very acceptable.  Weight is 156 pounds pulse oximetry 97% BMI 30.21  Skin warm and dry.  Nodes none.  No carotid bruits.  No thyromegaly.  Chest is clear.  Cardiac exam: Regular rate and rhythm.  No lower extremity pitting edema       Assessment & Plan:  Office hypertension-multiple blood pressure readings brought in today are acceptable at home.  Currently on Cozaar generic 100 mg daily.  Anxiety state-does not take antianxiety medication  History of irritable bowel syndrome treated with Bentyl and stable.  Has bouts of diarrhea.  Hyperlipidemia treated with Zocor 20 mg daily.  Previously before starting statin had pure hypertriglyceridemia with triglycerides 188 in November 2020.  With this visit, lipid panel and liver functions are both normal  Plan: Flu vaccine given.  Continue current medications and continue to monitor blood pressure at home.  CPE is due June 2023.  Recommend COVID booster.

## 2021-11-21 ENCOUNTER — Other Ambulatory Visit: Payer: Self-pay | Admitting: Internal Medicine

## 2022-01-13 ENCOUNTER — Other Ambulatory Visit: Payer: Self-pay | Admitting: Internal Medicine

## 2022-02-02 ENCOUNTER — Other Ambulatory Visit: Payer: Self-pay | Admitting: Internal Medicine

## 2022-04-14 ENCOUNTER — Other Ambulatory Visit: Payer: Medicare Other

## 2022-04-14 DIAGNOSIS — R5383 Other fatigue: Secondary | ICD-10-CM

## 2022-04-14 DIAGNOSIS — E559 Vitamin D deficiency, unspecified: Secondary | ICD-10-CM

## 2022-04-14 DIAGNOSIS — E781 Pure hyperglyceridemia: Secondary | ICD-10-CM

## 2022-04-14 DIAGNOSIS — I10 Essential (primary) hypertension: Secondary | ICD-10-CM

## 2022-04-15 LAB — CBC WITH DIFFERENTIAL/PLATELET
Absolute Monocytes: 440 cells/uL (ref 200–950)
Basophils Absolute: 30 cells/uL (ref 0–200)
Basophils Relative: 0.6 %
Eosinophils Absolute: 100 cells/uL (ref 15–500)
Eosinophils Relative: 2 %
HCT: 38.7 % (ref 35.0–45.0)
Hemoglobin: 12.9 g/dL (ref 11.7–15.5)
Lymphs Abs: 1675 cells/uL (ref 850–3900)
MCH: 29.3 pg (ref 27.0–33.0)
MCHC: 33.3 g/dL (ref 32.0–36.0)
MCV: 87.8 fL (ref 80.0–100.0)
MPV: 11.2 fL (ref 7.5–12.5)
Monocytes Relative: 8.8 %
Neutro Abs: 2755 cells/uL (ref 1500–7800)
Neutrophils Relative %: 55.1 %
Platelets: 220 10*3/uL (ref 140–400)
RBC: 4.41 10*6/uL (ref 3.80–5.10)
RDW: 12.2 % (ref 11.0–15.0)
Total Lymphocyte: 33.5 %
WBC: 5 10*3/uL (ref 3.8–10.8)

## 2022-04-15 LAB — VITAMIN D 25 HYDROXY (VIT D DEFICIENCY, FRACTURES): Vit D, 25-Hydroxy: 79 ng/mL (ref 30–100)

## 2022-04-15 LAB — COMPLETE METABOLIC PANEL WITH GFR
AG Ratio: 1.9 (calc) (ref 1.0–2.5)
ALT: 15 U/L (ref 6–29)
AST: 18 U/L (ref 10–35)
Albumin: 4 g/dL (ref 3.6–5.1)
Alkaline phosphatase (APISO): 66 U/L (ref 37–153)
BUN: 20 mg/dL (ref 7–25)
CO2: 27 mmol/L (ref 20–32)
Calcium: 9.2 mg/dL (ref 8.6–10.4)
Chloride: 107 mmol/L (ref 98–110)
Creat: 0.62 mg/dL (ref 0.60–1.00)
Globulin: 2.1 g/dL (calc) (ref 1.9–3.7)
Glucose, Bld: 102 mg/dL — ABNORMAL HIGH (ref 65–99)
Potassium: 4.4 mmol/L (ref 3.5–5.3)
Sodium: 141 mmol/L (ref 135–146)
Total Bilirubin: 0.4 mg/dL (ref 0.2–1.2)
Total Protein: 6.1 g/dL (ref 6.1–8.1)
eGFR: 92 mL/min/{1.73_m2} (ref >=60)

## 2022-04-15 LAB — TSH: TSH: 1.67 mIU/L (ref 0.40–4.50)

## 2022-04-15 LAB — LIPID PANEL
Cholesterol: 155 mg/dL (ref <200)
HDL: 64 mg/dL (ref >=50)
LDL Cholesterol (Calc): 71 mg/dL (calc)
Non-HDL Cholesterol (Calc): 91 mg/dL (calc) (ref <130)
Total CHOL/HDL Ratio: 2.4 (calc) (ref <5.0)
Triglycerides: 117 mg/dL (ref <150)

## 2022-04-17 ENCOUNTER — Ambulatory Visit (INDEPENDENT_AMBULATORY_CARE_PROVIDER_SITE_OTHER): Payer: Medicare Other | Admitting: Internal Medicine

## 2022-04-17 ENCOUNTER — Encounter: Payer: Self-pay | Admitting: Internal Medicine

## 2022-04-17 VITALS — BP 124/70 | HR 86 | Temp 97.4°F | Ht 63.0 in | Wt 161.8 lb

## 2022-04-17 DIAGNOSIS — E781 Pure hyperglyceridemia: Secondary | ICD-10-CM

## 2022-04-17 DIAGNOSIS — Z1231 Encounter for screening mammogram for malignant neoplasm of breast: Secondary | ICD-10-CM | POA: Diagnosis not present

## 2022-04-17 DIAGNOSIS — Z0000 Encounter for general adult medical examination without abnormal findings: Secondary | ICD-10-CM | POA: Diagnosis not present

## 2022-04-17 DIAGNOSIS — I10 Essential (primary) hypertension: Secondary | ICD-10-CM | POA: Diagnosis not present

## 2022-04-17 DIAGNOSIS — M818 Other osteoporosis without current pathological fracture: Secondary | ICD-10-CM

## 2022-04-17 LAB — POCT URINALYSIS DIPSTICK
Bilirubin, UA: NEGATIVE
Blood, UA: NEGATIVE
Glucose, UA: NEGATIVE
Ketones, UA: NEGATIVE
Leukocytes, UA: NEGATIVE
Nitrite, UA: NEGATIVE
Protein, UA: NEGATIVE
Spec Grav, UA: 1.01 (ref 1.010–1.025)
Urobilinogen, UA: NEGATIVE E.U./dL — AB
pH, UA: 6 (ref 5.0–8.0)

## 2022-04-17 NOTE — Progress Notes (Signed)
Subjective:    Patient ID: Linda Sosa, female    DOB: February 10, 1946, 76 y.o.   MRN: [id number]  HPI 76 year old  Female seen for health maintenance exam and evaluation of medical issues.  She has a history of hypertension and whitecoat syndrome.  She takes blood pressure regularly at home.  She is on losartan 100 mg daily.  History of osteoporosis.  Last bone density study was in 2020.  Has been treated by Dr. Dwyane Dee with Prolia but not recently.  She takes vitamin D supplements.  Before Prolia, she took Fosamax for a long time for age-related osteoporosis.  Has not had recent bone density study.  Last one was in 2020 and T score was -2.8. She declines bone density testing at this time.  History of hyperlipidemia treated with statin medication.  Lipid panel is completely normal.  Colonoscopy done 2014 with repeat study recommended in 10 years.  She stated she would prefer not to do this.  Ok to do Cologard instead next year if she desires.  Past medical history: Cholecystectomy 1997.  Was found to be vitamin D deficient in 2010.  Zocor started in 2011.  She has a history of labile hypertension.  She developed microcytic anemia in 2014.  Anemia improved with iron supplementation.  She had colonoscopy in February 2014 which was her first colonoscopy and was done by Dr. Sharlett Iles.  It was normal except for diverticulosis.  Has not had any further recurrence of anemia and has not wanted to have another colonoscopy.  Would be a candidate for Cologuard.  Social history: She is a widow.  Husband passed away from complications of non-Hodgkin's lymphoma.  She stays at home and enjoys gardening with flowers.  She completed 2 years of college.  She does not smoke or consume alcohol.  One daughter.  Family history: Father died at age 30 with heart problems with history of diabetes.  Mother died at age 67 of ovarian cancer.  1 brother died with heart problems.  2 sisters in fairly good health but one is a  diabetic.  Daughter in good health.    Review of Systems see above.  Denies chest pain, shortness of breath, bowel issues, musculoskeletal pain     Objective:   Physical Exam Vitals reviewed.  Constitutional:      General: She is not in acute distress.    Appearance: Normal appearance.  HENT:     Head: Normocephalic and atraumatic.     Right Ear: Tympanic membrane and external ear normal.     Left Ear: Tympanic membrane and external ear normal.     Nose: Nose normal. No rhinorrhea.     Mouth/Throat:     Mouth: Mucous membranes are moist.     Pharynx: Oropharynx is clear.  Eyes:     General:        Right eye: No discharge.        Left eye: No discharge.     Extraocular Movements: Extraocular movements intact.     Pupils: Pupils are equal, round, and reactive to light.  Neck:     Vascular: No carotid bruit.  Cardiovascular:     Rate and Rhythm: Normal rate and regular rhythm.     Heart sounds: Normal heart sounds. No murmur heard. Pulmonary:     Effort: Pulmonary effort is normal.     Breath sounds: Normal breath sounds. No rales.  Abdominal:     General: Bowel sounds are normal.  Palpations: Abdomen is soft. There is no mass.     Tenderness: There is no abdominal tenderness. There is no guarding or rebound.     Hernia: No hernia is present.  Genitourinary:    Comments: Bimanual normal Musculoskeletal:     Cervical back: Neck supple. No rigidity.     Right lower leg: No edema.     Left lower leg: No edema.  Lymphadenopathy:     Cervical: No cervical adenopathy.  Skin:    General: Skin is warm and dry.  Neurological:     General: No focal deficit present.     Mental Status: She is alert and oriented to person, place, and time.     Cranial Nerves: No cranial nerve deficit.     Sensory: No sensory deficit.     Motor: No weakness.     Gait: Gait normal.  Psychiatric:        Mood and Affect: Mood normal.        Behavior: Behavior normal.        Thought Content:  Thought content normal.        Judgment: Judgment normal.            Assessment & Plan:   Essential hypertension- stable on current regimen of losartan.  History of office hypertension.  Hypertriglyceridemia stable with statin medication -lipid panel is normal  History of irritable bowel syndrome treated with dicyclomine but that has improved  Cologuard candidate-screening due next year but she will likely not want to do another colonoscopy.  Initial colonoscopy in 2014 was normal.  Health maintenance-mammogram ordered  Immunizations-he has had Prevnar 13 and pneumococcal 23.  Does not want another COVID-vaccine.  Did have Zostavax vaccine in 2013.  Suggest tetanus update at pharmacy since she works in her garden.  History of osteoporosis-does not want to be on bone sparing therapy.  Last bone density study 2020 showed T score -2.8 in right femoral neck  Plan: Return in 6 months for follow-up.

## 2022-04-17 NOTE — Patient Instructions (Signed)
  Linda Sosa , Thank you for taking time to come for your Medicare Wellness Visit. I appreciate your ongoing commitment to your health goals. Please review the following plan we discussed and let me know if I can assist you in the future.   These are the goals we discussed:  Goals   None     This is a list of the screening recommended for you and due dates:  Health Maintenance  Topic Date Due   Tetanus Vaccine  10/17/2022*   COVID-19 Vaccine (3 - Pfizer risk series) 10/17/2022*   Hepatitis C Screening: USPSTF Recommendation to screen - Ages 18-79 yo.  10/17/2022*   Zoster (Shingles) Vaccine (1 of 2) 10/17/2022*   Flu Shot  06/09/2022   Mammogram  08/13/2022   Pneumonia Vaccine  Completed   DEXA scan (bone density measurement)  Completed   HPV Vaccine  Aged Out   Colon Cancer Screening  Discontinued  *Topic was postponed. The date shown is not the original due date.

## 2022-04-17 NOTE — Progress Notes (Signed)
I connected with  Linda Sosa on 04/17/22 by a audio enabled telemedicine application and verified that I am speaking with the correct person using two identifiers.  Patient Location: Home  Provider Location: Office/Clinic  I discussed the limitations of evaluation and management by telemedicine. The patient expressed understanding and agreed to proceed.  Subjective:   Linda Sosa is a 76 y.o. female who presents for Medicare Annual (Subsequent) preventive examination.  Review of Systems    Defer to PCP Cardiac Risk Factors include: advanced age (>63men, >44 women);dyslipidemia;hypertension;sedentary lifestyle;smoking/ tobacco exposure     Objective:    There were no vitals filed for this visit. There is no height or weight on file to calculate BMI.     04/17/2022    1:22 PM  Advanced Directives  Does Patient Have a Medical Advance Directive? Yes  Type of Estate agent of [place];Living will  Does patient want to make changes to medical advance directive? No - Patient declined  Copy of Healthcare Power of Attorney in Chart? No - copy requested    Current Medications (verified) Outpatient Encounter Medications as of 04/17/2022  Medication Sig   aspirin 81 MG tablet Take 81 mg by mouth daily.   calcium carbonate (OS-CAL) 600 MG TABS Take 600 mg by mouth 2 (two) times daily with a meal.     Cholecalciferol (VITAMIN D) 2000 UNITS CAPS Take by mouth.     dicyclomine (BENTYL) 20 MG tablet ONE TAB ONE HALF HOUR BEFORE LUNCH AND DINNER FOR IRRITABLE BOWEL SYMPTOMS   ferrous sulfate 325 (65 FE) MG tablet Take 325 mg by mouth 2 (two) times daily.   losartan (COZAAR) 100 MG tablet TAKE 1 TABLET BY MOUTH EVERY DAY   Multiple Vitamins-Minerals (MULTIVITAMIN WITH MINERALS) tablet Take 1 tablet by mouth daily.     ondansetron (ZOFRAN) 4 MG tablet Take 1 tablet (4 mg total) by mouth every 8 (eight) hours as needed for nausea or vomiting.   simvastatin (ZOCOR) 20  MG tablet TAKE 1 TABLET BY MOUTH EVERYDAY AT BEDTIME   No facility-administered encounter medications on file as of 04/17/2022.    Allergies (verified) Patient has no known allergies.   History: Past Medical History:  Diagnosis Date   Anemia    Hyperlipidemia    Osteoporosis    Vitamin D deficiency    Past Surgical History:  Procedure Laterality Date   BREAST EXCISIONAL BIOPSY Right 1991   BREAST EXCISIONAL BIOPSY Right 1971   LAPAROSCOPIC CHOLECYSTECTOMY  1997   TONSILLECTOMY  1952   Family History  Problem Relation Age of Onset   Cancer Mother    Diabetes Father    Heart disease Father    Colon cancer Neg Hx    Stomach cancer Neg Hx    Breast cancer Neg Hx    Osteoporosis Neg Hx    Thyroid disease Neg Hx    Social History   Socioeconomic History   Marital status: Married    Spouse name: Not on file   Number of children: Not on file   Years of education: Not on file   Highest education level: Not on file  Occupational History   Not on file  Tobacco Use   Smoking status: Former    Packs/day: 1.00    Years: 5.00    Total pack years: 5.00    Types: Cigarettes    Quit date: 12/01/1983    Years since quitting: 38.4   Smokeless tobacco: Never  Substance  and Sexual Activity   Alcohol use: No   Drug use: No   Sexual activity: Not on file  Other Topics Concern   Not on file  Social History Narrative   Not on file   Social Determinants of Health   Financial Resource Strain: Low Risk  (04/17/2022)   Overall Financial Resource Strain (CARDIA)    Difficulty of Paying Living Expenses: Not hard at all  Food Insecurity: No Food Insecurity (04/17/2022)   Hunger Vital Sign    Worried About Running Out of Food in the Last Year: Never true    Ran Out of Food in the Last Year: Never true  Transportation Needs: No Transportation Needs (04/17/2022)   PRAPARE - Administrator, Civil Service (Medical): No    Lack of Transportation (Non-Medical): No  Physical  Activity: Insufficiently Active (04/17/2022)   Exercise Vital Sign    Days of Exercise per Week: 7 days    Minutes of Exercise per Session: 20 min  Stress: No Stress Concern Present (04/17/2022)   [employer] of Occupational Health - Occupational Stress Questionnaire    Feeling of Stress : Not at all  Social Connections: Socially Isolated (04/17/2022)   Social Connection and Isolation Panel [NHANES]    Frequency of Communication with Friends and Family: More than three times a week    Frequency of Social Gatherings with Friends and Family: Twice a week    Attends Religious Services: Never    Database administrator or Organizations: No    Attends Banker Meetings: Never    Marital Status: Widowed    Tobacco Counseling Counseling given: Not Answered   Clinical Intake:  Pre-visit preparation completed: Yes  Pain : No/denies pain     Diabetes: No  How often do you need to have someone help you when you read instructions, pamphlets, or other written materials from your doctor or pharmacy?: 1 - Never  Diabetic?Not Diabetic  Interpreter Needed?: No      Activities of Daily Living    04/17/2022    1:23 PM 10/13/2021   10:51 AM  In your present state of health, do you have any difficulty performing the following activities:  Hearing? 0 0  Vision? 0 0  Difficulty concentrating or making decisions? 0 0  Walking or climbing stairs? 0 0  Dressing or bathing? 0 0  Doing errands, shopping? 0 0  Preparing Food and eating ? N   Using the Toilet? N   In the past six months, have you accidently leaked urine? N   Do you have problems with loss of bowel control? N   Managing your Medications? N   Managing your Finances? N   Housekeeping or managing your Housekeeping? N     Patient Care Team: Margaree Mackintosh, MD as PCP - General (Internal Medicine)  Indicate any recent Medical Services you may have received from other than [facility] providers in the past year (date may  be approximate).     Assessment:   This is a routine wellness examination for Linda Sosa.  Hearing/Vision screen No results found.  Dietary issues and exercise activities discussed: Current Exercise Habits: The patient does not participate in regular exercise at present, Exercise limited by: None identified   Goals Addressed   None   Depression Screen    04/17/2022   10:08 AM 05/01/2021   12:04 PM 03/21/2020    2:07 PM 03/16/2019   11:02 AM 01/10/2018    9:59 AM  07/15/2017    9:47 AM 01/06/2016    9:58 AM  PHQ 2/9 Scores  PHQ - 2 Score 0 0 0 0 0 0 0  PHQ- 9 Score 0          Fall Risk    04/17/2022   10:08 AM 05/01/2021   12:04 PM 03/21/2020    2:07 PM 01/10/2018    9:59 AM 07/15/2017    9:47 AM  Fall Risk   Falls in the past year? 0 0 0 No No  Number falls in past yr: 0 0 0    Injury with Fall? 0 0 0    Risk for fall due to : No Fall Risks No Fall Risks No Fall Risks    Follow up Falls prevention discussed Falls evaluation completed Falls evaluation completed      FALL RISK PREVENTION PERTAINING TO THE HOME:  Any stairs in or around the home? No  If so, are there any without handrails? No  Home free of loose throw rugs in walkways, pet beds, electrical cords, etc? Yes  Adequate lighting in your home to reduce risk of falls? Yes   ASSISTIVE DEVICES UTILIZED TO PREVENT FALLS:  Life alert? No  Use of a cane, walker or w/c? No  Grab bars in the bathroom? Yes  Shower chair or bench in shower? No  Elevated toilet seat or a handicapped toilet? No   TIMED UP AND GO:  Was the test performed? No .  Length of time to ambulate 10 feet: N/A sec.     Cognitive Function:        04/17/2022    1:24 PM  6CIT Screen  What Year? 0 points  What month? 0 points  What time? 0 points  Count back from 20 0 points  Months in reverse 0 points  Repeat phrase 0 points  Total Score 0 points    Immunizations Immunization History  Administered Date(s) Administered   Influenza Split  08/10/2013   Influenza,inj,Quad PF,6+ Mos 07/18/2015, 07/07/2016, 07/15/2017, 07/14/2018, 09/18/2019, 09/23/2020, 10/13/2021   Influenza-Unspecified 08/09/2014   PFIZER(Purple Top)SARS-COV-2 Vaccination 02/22/2020, 03/18/2020   Pneumococcal Conjugate-13 01/18/2015   Pneumococcal Polysaccharide-23 11/20/2011   Tdap 10/09/2010   Zoster, Live 01/22/2012    TDAP status: Due, Education has been provided regarding the importance of this vaccine. Advised may receive this vaccine at local pharmacy or Health Dept. Aware to provide a copy of the vaccination record if obtained from local pharmacy or Health Dept. Verbalized acceptance and understanding.  Flu Vaccine status: Up to date  Pneumococcal vaccine status: Up to date  Covid-19 vaccine status: Information provided on how to obtain vaccines.   Qualifies for Shingles Vaccine? Yes   Zostavax completed Yes   Shingrix Completed?: No.    Education has been provided regarding the importance of this vaccine. Patient has been advised to call insurance company to determine out of pocket expense if they have not yet received this vaccine. Advised may also receive vaccine at local pharmacy or Health Dept. Verbalized acceptance and understanding.  Screening Tests Health Maintenance  Topic Date Due   TETANUS/TDAP  10/17/2022 (Originally 10/09/2020)   COVID-19 Vaccine (3 - Pfizer risk series) 10/17/2022 (Originally 04/15/2020)   Hepatitis C Screening  10/17/2022 (Originally 09/20/1964)   Zoster Vaccines- Shingrix (1 of 2) 10/17/2022 (Originally 09/19/1964)   INFLUENZA VACCINE  06/09/2022   MAMMOGRAM  08/13/2022   Pneumonia Vaccine 74+ Years old  Completed   DEXA SCAN  Completed   HPV  VACCINES  Aged Out   COLONOSCOPY (Pts [id number] Insurance coverage will need to be confirmed)  Discontinued    Health Maintenance  There are no preventive care reminders to display for this patient.  Colorectal cancer screening: No longer required.   Mammogram status:  Completed 08/13/21. Repeat every year  Bone Density status: Completed 2020. Results reflect: Bone density results: OSTEOPOROSIS. Repeat every n/a years. Patient stopped prolia a couple years ago and does not want another DEXA at this time.  Lung Cancer Screening: (Low Dose CT Chest recommended if Age 57-80 years, 30 pack-year currently smoking OR have quit w/in 15years.) does qualify.   Lung Cancer Screening Referral: Defer to PCP  Additional Screening:  Hepatitis C Screening: does qualify; Completed n/a  Vision Screening: Recommended annual ophthalmology exams for early detection of glaucoma and other disorders of the eye. Is the patient up to date with their annual eye exam?  Yes  Who is the provider or what is the name of the office in which the patient attends annual eye exams?  If pt is not established with a provider, would they like to be referred to a provider to establish care?  N/a .   Dental Screening: Recommended annual dental exams for proper oral hygiene /a Community Resource Referral / Chronic Care Management: CRR required this visit?  No   CCM required this visit?  No      Plan:     I have personally reviewed and noted the following in the patient's chart:   Medical and social history Use of alcohol, tobacco or illicit drugs  Current medications and supplements including opioid prescriptions.  Functional ability and status Nutritional status Physical activity Advanced directives List of other physicians Hospitalizations, surgeries, and ER visits in previous 12 months Vitals Screenings to include cognitive, depression, and falls Referrals and appointments  In addition, I have reviewed and discussed with patient certain preventive protocols, quality metrics, and best practice recommendations. A written personalized care plan for preventive services as well as general preventive health recommendations were provided to patient.     Jama FlavorsArielle N Mathews,  CMA   04/17/2022   Nurse Notes: Non face to face minutes 18 minutes  I, Margaree MackintoshMary J Youlanda Tomassetti, MD, have reviewed all documentation for this visit. The documentation on 04/17/22 for the exam, diagnosis, procedures, and orders are all accurate and complete.

## 2022-04-17 NOTE — Patient Instructions (Addendum)
It was a pleasure to see you today. Continue current meds and return in 6 months for Ov and BP check. Labs are stable.  Please have update on your tetanus vaccine at pharmacy.

## 2022-05-15 ENCOUNTER — Other Ambulatory Visit: Payer: Self-pay | Admitting: Internal Medicine

## 2022-05-27 NOTE — Progress Notes (Signed)
Has appt October

## 2022-07-09 ENCOUNTER — Ambulatory Visit (INDEPENDENT_AMBULATORY_CARE_PROVIDER_SITE_OTHER): Payer: Medicare Other

## 2022-07-09 ENCOUNTER — Encounter (HOSPITAL_COMMUNITY): Payer: Self-pay | Admitting: Emergency Medicine

## 2022-07-09 ENCOUNTER — Ambulatory Visit (HOSPITAL_COMMUNITY)
Admission: EM | Admit: 2022-07-09 | Discharge: 2022-07-09 | Disposition: A | Discharge status: Home / Self Care | Payer: Medicare Other | Attending: Family Medicine | Admitting: Family Medicine

## 2022-07-09 DIAGNOSIS — S42212A Unspecified displaced fracture of surgical neck of left humerus, initial encounter for closed fracture: Secondary | ICD-10-CM

## 2022-07-09 DIAGNOSIS — M79622 Pain in left upper arm: Secondary | ICD-10-CM | POA: Diagnosis not present

## 2022-07-09 MED ORDER — HYDROCODONE-ACETAMINOPHEN 5-325 MG PO TABS: 1.0000 | ORAL_TABLET | Freq: Four times a day (QID) | ORAL | 0 refills | Status: DC | PRN

## 2022-07-09 NOTE — ED Provider Notes (Signed)
[facility]   [id number] 07/09/22 Arrival Time: 1041  ASSESSMENT & PLAN:  1. Left upper arm pain    I have personally viewed the imaging studies ordered this visit. LEFT humeral surgical neck fracture.  Discharge Medication List as of 07/09/2022 11:49 AM     START taking these medications   Details  HYDROcodone-acetaminophen (NORCO/VICODIN) 5-325 MG tablet Take 1 tablet by mouth every 6 (six) hours as needed for moderate pain or severe pain., Starting Thu 07/09/2022, Normal      Ibuprofen with food for baseline pain management.  Orders Placed This Encounter  Procedures   DG Humerus Left   Apply Sling and Swathe   Recommend:  Follow-up Information     Schedule an appointment as soon as possible for a visit  with Samson Frederic, MD.   Specialty: Orthopedic Surgery Contact information: [address] [place] [address] [phone]                  Discharge Instructions      Be aware, you have been prescribed pain medications that may cause drowsiness. While taking this medication, do not take any other medications containing acetaminophen (Tylenol). Do not combine with alcohol or other illicit drugs. Please do not drive, operate heavy machinery, or take part in activities that require making important decisions while on this medication as your judgement may be clouded.      [place] Controlled Substances Registry consulted for this patient. I feel the risk/benefit ratio today is favorable for proceeding with this prescription for a controlled substance. Medication sedation precautions given.  Reviewed expectations re: course of current medical issues. Questions answered. Outlined signs and symptoms indicating need for more acute intervention. Patient verbalized understanding. After Visit Summary given.  SUBJECTIVE: History from: patient. Linda Sosa is a 76 y.o. female who reports LEFT upper arm pain; s/p fall this am. Hurts  to lift LUE. No extremity sensation changes or weakness. No tx PTA.  Past Surgical History:  Procedure Laterality Date   BREAST EXCISIONAL BIOPSY Right 1991   BREAST EXCISIONAL BIOPSY Right 1971   LAPAROSCOPIC CHOLECYSTECTOMY  1997   TONSILLECTOMY  1952      OBJECTIVE:  Vitals:   07/09/22 1105  BP: (!) 151/70  Pulse: 89  Resp: 19  Temp: 98.6 F (37 C)  TempSrc: Oral  SpO2: 97%    General appearance: alert; no distress HEENT: [place]; AT Neck: supple with FROM Resp: unlabored respirations Extremities: LUE: warm with well perfused appearance; poorly localized moderate tenderness over  left upper arm; without gross deformities; swelling: none; bruising: none; shoulder ROM: limited by reported pain; normal ROM at elbow; no specific bony elbow TTP CV: brisk extremity capillary refill of LUE; 2+ radial pulse of LUE. Skin: warm and dry; no visible rashes Neurologic: gait normal; normal sensation and strength of LUE Psychological: alert and cooperative; normal mood and affect  Imaging: DG Humerus Left  Result Date: 07/09/2022 CLINICAL DATA:  Pain status post fall EXAM: LEFT HUMERUS - 2+ VIEW COMPARISON:  None available FINDINGS: Mildly displaced and impacted fracture of the left humeral neck. Linear opacities in the left inferior lung is likely due to atelectasis or scarring. IMPRESSION: Mildly displaced and impacted left humeral neck fracture. Electronically Signed   By: Acquanetta Belling M.D.   On: 07/09/2022 11:33     No Known Allergies  Past Medical History:  Diagnosis Date   Anemia    Hyperlipidemia    Osteoporosis  Vitamin D deficiency    Social History   Socioeconomic History   Marital status: Married    Spouse name: Not on file   Number of children: Not on file   Years of education: Not on file   Highest education level: Not on file  Occupational History   Not on file  Tobacco Use   Smoking status: Former    Packs/day: 1.00    Years: 5.00    Total pack years:  5.00    Types: Cigarettes    Quit date: 12/01/1983    Years since quitting: 38.6   Smokeless tobacco: Never  Substance and Sexual Activity   Alcohol use: No   Drug use: No   Sexual activity: Not on file  Other Topics Concern   Not on file  Social History Narrative   Not on file   Social Determinants of Health   Financial Resource Strain: Low Risk  (04/17/2022)   Overall Financial Resource Strain (CARDIA)    Difficulty of Paying Living Expenses: Not hard at all  Food Insecurity: No Food Insecurity (04/17/2022)   Hunger Vital Sign    Worried About Running Out of Food in the Last Year: Never true    Ran Out of Food in the Last Year: Never true  Transportation Needs: No Transportation Needs (04/17/2022)   PRAPARE - Administrator, Civil Service (Medical): No    Lack of Transportation (Non-Medical): No  Physical Activity: Insufficiently Active (04/17/2022)   Exercise Vital Sign    Days of Exercise per Week: 7 days    Minutes of Exercise per Session: 20 min  Stress: No Stress Concern Present (04/17/2022)   [employer] of Occupational Health - Occupational Stress Questionnaire    Feeling of Stress : Not at all  Social Connections: Socially Isolated (04/17/2022)   Social Connection and Isolation Panel [NHANES]    Frequency of Communication with Friends and Family: More than three times a week    Frequency of Social Gatherings with Friends and Family: Twice a week    Attends Religious Services: Never    Database administrator or Organizations: No    Attends Banker Meetings: Never    Marital Status: Widowed   Family History  Problem Relation Age of Onset   Cancer Mother    Diabetes Father    Heart disease Father    Colon cancer Neg Hx    Stomach cancer Neg Hx    Breast cancer Neg Hx    Osteoporosis Neg Hx    Thyroid disease Neg Hx    Past Surgical History:  Procedure Laterality Date   BREAST EXCISIONAL BIOPSY Right 1991   BREAST EXCISIONAL BIOPSY Right  1971   LAPAROSCOPIC CHOLECYSTECTOMY  1997   TONSILLECTOMY  1952       Mardella Layman, MD 07/09/22 1257

## 2022-07-09 NOTE — ED Triage Notes (Signed)
Pt reports after getting up off couch this morning fell and hit left arm on bookshelf. C/o left upper arm pain.

## 2022-07-09 NOTE — Discharge Instructions (Addendum)

## 2022-08-03 ENCOUNTER — Other Ambulatory Visit: Payer: Self-pay | Admitting: Internal Medicine

## 2022-08-17 ENCOUNTER — Ambulatory Visit: Payer: Medicare Other

## 2022-09-07 ENCOUNTER — Ambulatory Visit
Admission: RE | Admit: 2022-09-07 | Discharge: 2022-09-07 | Disposition: A | Discharge status: Home / Self Care | Payer: Medicare Other | Source: Ambulatory Visit | Attending: Internal Medicine | Admitting: Internal Medicine

## 2022-10-16 ENCOUNTER — Other Ambulatory Visit: Payer: Medicare Other

## 2022-10-20 ENCOUNTER — Ambulatory Visit: Payer: Medicare Other | Admitting: Internal Medicine

## 2022-10-20 ENCOUNTER — Encounter: Payer: Self-pay | Admitting: Internal Medicine

## 2022-10-20 VITALS — BP 130/82 | HR 81 | Temp 98.3°F | Ht 63.0 in | Wt 164.0 lb

## 2022-10-20 DIAGNOSIS — Z23 Encounter for immunization: Secondary | ICD-10-CM

## 2022-10-20 DIAGNOSIS — E781 Pure hyperglyceridemia: Secondary | ICD-10-CM

## 2022-10-20 DIAGNOSIS — I10 Essential (primary) hypertension: Secondary | ICD-10-CM

## 2022-10-20 DIAGNOSIS — M818 Other osteoporosis without current pathological fracture: Secondary | ICD-10-CM

## 2022-10-20 DIAGNOSIS — Z8781 Personal history of (healed) traumatic fracture: Secondary | ICD-10-CM | POA: Diagnosis not present

## 2022-10-20 NOTE — Progress Notes (Signed)
   Subjective:    Patient ID: Linda Sosa, female    DOB: Feb 21, 1946, 76 y.o.   MRN: [id number]  HPI 76 year old Female seen for 6 month recheck. Seen at Urgent Care in  August 2023 with a fall and suffered closed displaced fracture of surgical neck left humerus. Referred to Dr. Veda Canning.Went to PT and has recovered. Given Pneumococcal 20 vaccine today. Had medicare wellness visit in June 2023.  She has a history hypertension and whitecoat syndrome.  She is currently on losartan 100 mg daily.  History of hyperlipidemia treated with Zocor.  History of osteoporosis and previously treated with Fosamax and Prolia.  She takes vitamin D supplements.  Last bone density study was in 2020 and T-score was -2.8.  History of labile hypertension.  Zocor started in 2011.  History of microcytic anemia in 2014 that improved with iron supplementation.  Had first colonoscopy at that time done by Dr. Jarold Motto and it was normal except for diverticulosis.  Has not wanted to have additional colonoscopies.  Social history: She is a widow.  Husband passed away from complications of non-Hodgkin's lymphoma.  She stays at home and enjoys gardening.  She does not smoke or consume alcohol.  1 daughter.    Review of Systems no new complaints.  Denies chest pain or shortness of breath     Objective:   Physical Exam Blood pressure 130/82, pulse 81, temperature 98.3 degrees pulse oximetry 99% weight 164 pounds BMI 29.05 height 5 feet 3 inches  Skin: Warm and dry.  No cervical adenopathy.  No thyroid megaly neck supple.  No carotid bruits.  Chest clear.  Cardiac exam: Regular rate and rhythm without ectopy.  No lower extremity edema.     Assessment & Plan:    Closed displaced fracture left humerus due to a fall in August 2023  Hyperlipidemia-treated with simvastatin  Hypertension-labile at times-treated with losartan  Remote history of microcytic anemia  History of diverticulosis  Osteoporosis  Plan:   Medicare wellness visit scheduled for June 2024.  Plan: Pneumococcal 20 vaccine given.  Had high-dose flu vaccine November 2023.  Return in 6 months or as needed.

## 2022-11-17 ENCOUNTER — Other Ambulatory Visit: Payer: Self-pay

## 2022-11-17 ENCOUNTER — Emergency Department (HOSPITAL_COMMUNITY): Payer: Medicare Other

## 2022-11-17 ENCOUNTER — Telehealth: Payer: Self-pay | Admitting: Internal Medicine

## 2022-11-17 ENCOUNTER — Observation Stay (HOSPITAL_COMMUNITY)
Admission: EM | Admit: 2022-11-17 | Discharge: 2022-11-18 | Disposition: A | Discharge status: Home / Self Care | Payer: Medicare Other | Attending: Student | Admitting: Student

## 2022-11-17 ENCOUNTER — Encounter (HOSPITAL_COMMUNITY): Payer: Self-pay | Admitting: Internal Medicine

## 2022-11-17 DIAGNOSIS — R5383 Other fatigue: Secondary | ICD-10-CM | POA: Diagnosis not present

## 2022-11-17 DIAGNOSIS — Z1152 Encounter for screening for COVID-19: Secondary | ICD-10-CM | POA: Insufficient documentation

## 2022-11-17 DIAGNOSIS — E785 Hyperlipidemia, unspecified: Secondary | ICD-10-CM | POA: Diagnosis present

## 2022-11-17 DIAGNOSIS — R072 Precordial pain: Secondary | ICD-10-CM | POA: Diagnosis not present

## 2022-11-17 DIAGNOSIS — Z79899 Other long term (current) drug therapy: Secondary | ICD-10-CM | POA: Diagnosis not present

## 2022-11-17 DIAGNOSIS — R002 Palpitations: Secondary | ICD-10-CM | POA: Diagnosis not present

## 2022-11-17 DIAGNOSIS — Z66 Do not resuscitate: Secondary | ICD-10-CM | POA: Insufficient documentation

## 2022-11-17 DIAGNOSIS — R7989 Other specified abnormal findings of blood chemistry: Secondary | ICD-10-CM | POA: Insufficient documentation

## 2022-11-17 DIAGNOSIS — R748 Abnormal levels of other serum enzymes: Secondary | ICD-10-CM

## 2022-11-17 DIAGNOSIS — I10 Essential (primary) hypertension: Secondary | ICD-10-CM | POA: Insufficient documentation

## 2022-11-17 DIAGNOSIS — R079 Chest pain, unspecified: Secondary | ICD-10-CM | POA: Diagnosis present

## 2022-11-17 DIAGNOSIS — R0789 Other chest pain: Secondary | ICD-10-CM

## 2022-11-17 DIAGNOSIS — Z87891 Personal history of nicotine dependence: Secondary | ICD-10-CM | POA: Diagnosis not present

## 2022-11-17 DIAGNOSIS — I071 Rheumatic tricuspid insufficiency: Secondary | ICD-10-CM | POA: Diagnosis not present

## 2022-11-17 DIAGNOSIS — Z7982 Long term (current) use of aspirin: Secondary | ICD-10-CM | POA: Diagnosis not present

## 2022-11-17 HISTORY — DX: Essential (primary) hypertension: I10

## 2022-11-17 LAB — TROPONIN I (HIGH SENSITIVITY)
Troponin I (High Sensitivity): 34 ng/L — ABNORMAL HIGH (ref <18)
Troponin I (High Sensitivity): 32 ng/L — ABNORMAL HIGH (ref <18)

## 2022-11-17 LAB — CBC
HCT: 41.7 % (ref 36.0–46.0)
Hemoglobin: 13.5 g/dL (ref 12.0–15.0)
MCH: 28.4 pg (ref 26.0–34.0)
MCHC: 32.4 g/dL (ref 30.0–36.0)
MCV: 87.6 fL (ref 80.0–100.0)
Platelets: 282 10*3/uL (ref 150–400)
RBC: 4.76 MIL/uL (ref 3.87–5.11)
RDW: 13.2 % (ref 11.5–15.5)
WBC: 9.3 10*3/uL (ref 4.0–10.5)
nRBC: 0 % (ref 0.0–0.2)

## 2022-11-17 LAB — HEPATIC FUNCTION PANEL
ALT: 15 U/L (ref 0–44)
AST: 22 U/L (ref 15–41)
Albumin: 4.1 g/dL (ref 3.5–5.0)
Alkaline Phosphatase: 64 U/L (ref 38–126)
Bilirubin, Direct: <0.1 mg/dL (ref 0.0–0.2)
Total Bilirubin: 0.7 mg/dL (ref 0.3–1.2)
Total Protein: 6.8 g/dL (ref 6.5–8.1)

## 2022-11-17 LAB — BASIC METABOLIC PANEL
Anion gap: 10 (ref 5–15)
BUN: 22 mg/dL (ref 8–23)
CO2: 25 mmol/L (ref 22–32)
Calcium: 9.9 mg/dL (ref 8.9–10.3)
Chloride: 104 mmol/L (ref 98–111)
Creatinine, Ser: 0.7 mg/dL (ref 0.44–1.00)
GFR, Estimated: >60 mL/min (ref >60)
Glucose, Bld: 108 mg/dL — ABNORMAL HIGH (ref 70–99)
Potassium: 4.1 mmol/L (ref 3.5–5.1)
Sodium: 139 mmol/L (ref 135–145)

## 2022-11-17 LAB — TSH: TSH: 1.291 u[IU]/mL (ref 0.350–4.500)

## 2022-11-17 LAB — RESP PANEL BY RT-PCR (RSV, FLU A&B, COVID)  RVPGX2
Influenza A by PCR: NEGATIVE
Influenza B by PCR: NEGATIVE
Resp Syncytial Virus by PCR: NEGATIVE
SARS Coronavirus 2 by RT PCR: NEGATIVE

## 2022-11-17 LAB — MAGNESIUM: Magnesium: 2 mg/dL (ref 1.7–2.4)

## 2022-11-17 MED ORDER — HYDRALAZINE HCL 20 MG/ML IJ SOLN: 5.0000 mg | INTRAMUSCULAR | Status: DC | PRN

## 2022-11-17 MED ORDER — ACETAMINOPHEN 325 MG PO TABS: 650.0000 mg | ORAL_TABLET | ORAL | Status: DC | PRN

## 2022-11-17 MED ORDER — SIMVASTATIN 20 MG PO TABS: 20.0000 mg | ORAL_TABLET | Freq: Every day | ORAL | Status: DC

## 2022-11-17 MED ORDER — HEPARIN (PORCINE) 25000 UT/250ML-% IV SOLN
1000.0000 [IU]/h | INTRAVENOUS | Status: DC
  Administered 2022-11-17: 1000 [IU]/h via INTRAVENOUS
  Filled 2022-11-17: qty 250

## 2022-11-17 MED ORDER — HEPARIN BOLUS VIA INFUSION
4000.0000 [IU] | Freq: Once | INTRAVENOUS | Status: AC
  Administered 2022-11-17: 4000 [IU] via INTRAVENOUS
  Filled 2022-11-17: qty 4000

## 2022-11-17 MED ORDER — ASPIRIN 81 MG PO CHEW
243.0000 mg | CHEWABLE_TABLET | Freq: Once | ORAL | Status: AC
  Administered 2022-11-17: 243 mg via ORAL
  Filled 2022-11-17: qty 3

## 2022-11-17 MED ORDER — LOSARTAN POTASSIUM 50 MG PO TABS: 100.0000 mg | ORAL_TABLET | Freq: Every day | ORAL | Status: DC

## 2022-11-17 MED ORDER — SODIUM CHLORIDE 0.9% FLUSH: 3.0000 mL | Freq: Two times a day (BID) | INTRAVENOUS | Status: DC

## 2022-11-17 MED ORDER — METOPROLOL TARTRATE 25 MG PO TABS
12.5000 mg | ORAL_TABLET | Freq: Two times a day (BID) | ORAL | Status: DC
  Administered 2022-11-17: 12.5 mg via ORAL
  Filled 2022-11-17: qty 1

## 2022-11-17 MED ORDER — ATORVASTATIN CALCIUM 40 MG PO TABS
40.0000 mg | ORAL_TABLET | Freq: Every day | ORAL | Status: DC
  Administered 2022-11-17: 40 mg via ORAL
  Filled 2022-11-17: qty 1

## 2022-11-17 MED ORDER — ONDANSETRON HCL 4 MG/2ML IJ SOLN: 4.0000 mg | Freq: Four times a day (QID) | INTRAMUSCULAR | Status: DC | PRN

## 2022-11-17 NOTE — ED Provider Triage Note (Signed)
Emergency Medicine Provider Triage Evaluation Note  Linda Sosa , a 77 y.o. female  was evaluated in triage.  Pt complains of chest pain and palpitations which began 3 days.  Sudden onset of chest heaviness which has been constant since this morning.  Called her PCP who recommended she be seen here.  She did take an 81 mg aspirin without any improvement in symptoms.  Also reports feeling overall weak and fatigued over the last couple of days.  Does have a family history of CAD.  No tobacco use, no prior history of CAD for her.  Review of Systems  Positive: Chest heaviness, sob Negative: Fever, cough,   Physical Exam  BP (!) 158/82 (BP Location: Right Arm)   Pulse (!) 101   Temp 98.8 F (37.1 C) (Oral)   Resp 16   SpO2 100%  Gen:   Awake, no distress   Resp:  Normal effort  MSK:   Moves extremities without difficulty  Other:    Medical Decision Making  Medically screening exam initiated at 11:13 AM.  Appropriate orders placed.  Linda Sosa was informed that the remainder of the evaluation will be completed by another provider, this initial triage assessment does not replace that evaluation, and the importance of remaining in the ED until their evaluation is complete.     Janeece Fitting, PA-C 11/17/22 1119

## 2022-11-17 NOTE — ED Provider Notes (Signed)
[facility] EMERGENCY DEPARTMENT Provider Note   CSN: [id number] Arrival date & time: 11/17/22  1056     History  Chief Complaint  Patient presents with   Chest Pain   Palpitations   Fatigue    Linda Sosa is a 77 y.o. female.   Chest Pain Associated symptoms: fatigue and palpitations   Palpitations Associated symptoms: chest pain   Patient presents for chest pain, fatigue, and palpitations.  Medical history includes HLD, osteoporosis, anemia, HTN, anxiety, IBS.  She has never seen a cardiologist.  She does not smoke.  She does have a father and brother who had heart attacks in early 69s.  On Saturday, she had a transient episode of palpitations.  She describes this is feeling as if her heart was racing.  She has not had any further episodes of palpitations since that time.  She has had ongoing fatigue.  Today, she had a feeling of chest heaviness.  For this reason, she presents to the ED.  She took one 81 mg of aspirin earlier today.  She has not taken anything else for symptomatic relief.  Currently, she is asymptomatic.       Home Medications Prior to Admission medications   Medication Sig Start Date End Date Taking? Authorizing Provider  Acetaminophen (TYLENOL PO) Take 2 tablets by mouth daily as needed (pain, fever, headache).   Yes [provider]  aspirin 81 MG tablet Take 81 mg by mouth in the morning.   Yes [provider]  calcium carbonate (OS-CAL) 600 MG TABS Take 600 mg by mouth in the morning.   Yes [provider]  Cholecalciferol (VITAMIN D-3 PO) Take 1 capsule by mouth in the morning.   Yes [provider]  ferrous sulfate 325 (65 FE) MG tablet Take 325 mg by mouth in the morning.   Yes [provider]  losartan (COZAAR) 100 MG tablet TAKE 1 TABLET BY MOUTH EVERY DAY Patient taking differently: Take 100 mg by mouth in the morning. 08/03/22  Yes Baxley, Cresenciano Lick, MD  Multiple Vitamins-Minerals  (MULTIVITAMIN WITH MINERALS) tablet Take 1 tablet by mouth in the morning.   Yes [provider]  simvastatin (ZOCOR) 20 MG tablet TAKE 1 TABLET BY MOUTH EVERYDAY AT BEDTIME Patient taking differently: Take 20 mg by mouth daily with supper. 05/15/22  Yes Baxley, Cresenciano Lick, MD      Allergies    Patient has no known allergies.    Review of Systems   Review of Systems  Constitutional:  Positive for fatigue.  Cardiovascular:  Positive for chest pain and palpitations.  All other systems reviewed and are negative.   Physical Exam Updated Vital Signs BP 118/61   Pulse 76   Temp 98.7 F (37.1 C) (Oral)   Resp 17   SpO2 97%  Physical Exam Vitals and nursing note reviewed.  Constitutional:      General: She is not in acute distress.    Appearance: She is well-developed. She is not ill-appearing, toxic-appearing or diaphoretic.  HENT:     Head: Normocephalic and atraumatic.  Eyes:     Conjunctiva/sclera: Conjunctivae normal.  Cardiovascular:     Rate and Rhythm: Normal rate and regular rhythm.     Heart sounds: No murmur heard. Pulmonary:     Effort: Pulmonary effort is normal. No respiratory distress.     Breath sounds: Normal breath sounds. No decreased breath sounds, wheezing, rhonchi or rales.  Chest:  Chest wall: No tenderness.  Abdominal:     Palpations: Abdomen is soft.     Tenderness: There is no abdominal tenderness.  Musculoskeletal:        General: No swelling.     Cervical back: Normal range of motion and neck supple.     Right lower leg: No edema.     Left lower leg: No edema.  Skin:    General: Skin is warm and dry.     Capillary Refill: Capillary refill takes less than 2 seconds.     Coloration: Skin is not pale.  Neurological:     General: No focal deficit present.     Mental Status: She is alert and oriented to person, place, and time.  Psychiatric:        Mood and Affect: Mood normal.        Behavior: Behavior normal.     ED Results /  Procedures / Treatments   Labs (all labs ordered are listed, but only abnormal results are displayed) Labs Reviewed  BASIC METABOLIC PANEL - Abnormal; Notable for the following components:      Result Value   Glucose, Bld 108 (*)    All other components within normal limits  TROPONIN I (HIGH SENSITIVITY) - Abnormal; Notable for the following components:   Troponin I (High Sensitivity) 34 (*)    All other components within normal limits  TROPONIN I (HIGH SENSITIVITY) - Abnormal; Notable for the following components:   Troponin I (High Sensitivity) 32 (*)    All other components within normal limits  RESP PANEL BY RT-PCR (RSV, FLU A&B, COVID)  RVPGX2  CBC  MAGNESIUM  HEPATIC FUNCTION PANEL  TSH  HEPARIN LEVEL (UNFRACTIONATED)  HEPARIN LEVEL (UNFRACTIONATED)  CBC  LIPID PANEL  HEMOGLOBIN A1C    EKG EKG Interpretation  Date/Time:  Tuesday November 17 2022 11:00:32 EST Ventricular Rate:  101 PR Interval:  122 QRS Duration: 74 QT Interval:  312 QTC Calculation: 404 R Axis:   20 Text Interpretation: Sinus tachycardia Minimal voltage criteria for LVH, may be normal variant ( R in aVL ) Borderline ECG No previous ECGs available Confirmed by Gloris Manchester ([id number]) on 11/17/2022 1:00:05 PM  Radiology DG Chest 1 View  Result Date: 11/17/2022 CLINICAL DATA:  Chest pain EXAM: CHEST  1 VIEW COMPARISON:  None Available. FINDINGS: Hyperinflation. No consolidation, pneumothorax or effusion. Calcified aorta. Normal cardiopericardial silhouette. Slight left basilar atelectasis or scar. There is a double density overlying the heart. Possible hiatal hernia. Surgical clips in the right upper quadrant. IMPRESSION: Hyperinflation.  Slight left basilar atelectasis or scar. Possible hiatal hernia. Electronically Signed   By: Karen Kays M.D.   On: 11/17/2022 11:47    Procedures Procedures    Medications Ordered in ED Medications  heparin ADULT infusion 100 units/mL (25000 units/272mL) (1,000 Units/hr  Intravenous New Bag/Given 11/17/22 1731)  sodium chloride flush (NS) 0.9 % injection 3 mL (has no administration in time range)  losartan (COZAAR) tablet 100 mg (has no administration in time range)  acetaminophen (TYLENOL) tablet 650 mg (has no administration in time range)  ondansetron (ZOFRAN) injection 4 mg (has no administration in time range)  atorvastatin (LIPITOR) tablet 40 mg (40 mg Oral Given 11/17/22 1741)  metoprolol tartrate (LOPRESSOR) tablet 12.5 mg (has no administration in time range)  hydrALAZINE (APRESOLINE) injection 5 mg (has no administration in time range)  aspirin chewable tablet 243 mg (243 mg Oral Given 11/17/22 1721)  heparin bolus via infusion 4,000 Units (  4,000 Units Intravenous Bolus from Bag 11/17/22 1744)    ED Course/ Medical Decision Making/ A&P                           Medical Decision Making Amount and/or Complexity of Data Reviewed Labs: ordered.  Risk OTC drugs. Decision regarding hospitalization.   This patient presents to the ED for concern of chest pressure, this involves an extensive number of treatment options, and is a complaint that carries with it a high risk of complications and morbidity.  The differential diagnosis includes ACS, anxiety, arrhythmia, URI, reactive airway disease, CHF   Co morbidities that complicate the patient evaluation  HLD, osteoporosis, anemia, HTN, anxiety, IBS   Additional history obtained:  Additional history obtained from N/A External records from outside source obtained and reviewed including EMR   Lab Tests:  I Ordered, and personally interpreted labs.  The pertinent results include: Normal hemoglobin, no leukocytosis, normal electrolytes, mild elevation in troponins   Imaging Studies ordered:  I ordered imaging studies including chest x-ray I independently visualized and interpreted imaging which showed no acute findings I agree with the radiologist interpretation   Cardiac Monitoring: /  EKG:  The patient was maintained on a cardiac monitor.  I personally viewed and interpreted the cardiac monitored which showed an underlying rhythm of: Sinus rhythm   Consultations Obtained:  I requested consultation with the cardiologist, Dr. Melton Alar,  and discussed lab and imaging findings as well as pertinent plan - they recommend: Heparin, admission to hospitalist, possible cath tomorrow   Problem List / ED Course / Critical interventions / Medication management  Patient presents for fatigue over the past 3 days and chest heaviness this morning which has since resolved.  Vital signs on arrival were notable for slight tachycardia and hypertension.  This is improved at time of bending in the ED.  Prior to being bedded in the ED, diagnostic workup was initiated.  EKG does not show any concerning ST segment abnormalities.  Chest x-ray shows no acute findings.  Patient was placed on bedside cardiac monitor.  Patient took 81 mg ASA prior to arrival.  Additional 243 mg was ordered.  Laboratory workup showed mild elevation in troponins.  I discussed this with cardiologist on-call, Dr. Melton Alar, who advises heparinization and admission to hospitalist for possible heart cath tomorrow.  Heparin was ordered.  Patient was admitted for further management. I ordered medication including ASA and heparin for possible ACS Reevaluation of the patient after these medicines showed that the patient stayed the same I have reviewed the patients home medicines and have made adjustments as needed   Social Determinants of Health:  Lives independently        Final Clinical Impression(s) / ED Diagnoses Final diagnoses:  Chest pressure    Rx / DC Orders ED Discharge Orders     None         Gloris Manchester, MD 11/17/22 1831

## 2022-11-17 NOTE — H&P (View-Only) (Signed)
CARDIOLOGY CONSULT NOTE  Patient ID: Linda Sosa MRN: [id number] DOB/AGE: 08-01-1946 77 y.o.  Admit date: 11/17/2022 Referring Physician  Dr Ophelia Charter Primary Physician:  Margaree Mackintosh, MD Reason for Consultation  chest pain  Patient ID: Linda Sosa, female    DOB: 1946/06/06, 77 y.o.   MRN: [id number]  Chief Complaint  Patient presents with   Chest Pain   Palpitations   Fatigue   HPI:    Linda Sosa  is a 77 y.o. female with past medical history significant for hypertension and hyperlipidemia who presented to the ED with chest pain. Patient has no known cardiac history and she has never seen a cardiologist in the past.   She came to the hospital due to chest pain, palpitations, and fatigue that has been worsening over the last few weeks. She has taken aspirin and it helped a little bit. Patient denies shortness of breath, diaphoresis, syncope, edema, PND, orthopnea.   Since coming to the hospital, patient states that her chest pain symptoms have essentially resolved.  She feels much more comfortable today.  Symptoms overall has been worse in the last 3 days with retrosternal chest tightness.  Past Medical History:  Diagnosis Date   Anemia    Hyperlipidemia    Hypertension    Osteoporosis    Vitamin D deficiency    Past Surgical History:  Procedure Laterality Date   BREAST EXCISIONAL BIOPSY Right 1991   BREAST EXCISIONAL BIOPSY Right 1971   LAPAROSCOPIC CHOLECYSTECTOMY  1997   TONSILLECTOMY  1952   Social History   Tobacco Use   Smoking status: Former    Packs/day: 1.00    Years: 5.00    Total pack years: 5.00    Types: Cigarettes    Quit date: 12/01/1983    Years since quitting: 38.9   Smokeless tobacco: Never  Substance Use Topics   Alcohol use: No    Family History  Problem Relation Age of Onset   Cancer Mother    Diabetes Father    Heart disease Father    Coronary artery disease Father 87   Coronary artery disease Brother 24   Colon cancer Neg  Hx    Stomach cancer Neg Hx    Breast cancer Neg Hx    Osteoporosis Neg Hx    Thyroid disease Neg Hx     Marital Status: Married  ROS  Review of Systems  Constitutional: Positive for malaise/fatigue.  Cardiovascular:  Positive for chest pain and palpitations.   Objective      11/17/2022    4:30 PM 11/17/2022    3:30 PM 11/17/2022    3:00 PM  Vitals with BMI  Systolic 118 125 782  Diastolic 61 56 59  Pulse 76 73 78    Blood pressure 118/61, pulse 76, temperature 98.8 F (37.1 C), resp. rate 17, SpO2 97 %.    Physical Exam Neck:     Vascular: No carotid bruit or JVD.  Cardiovascular:     Rate and Rhythm: Normal rate and regular rhythm.     Pulses: Intact distal pulses.     Heart sounds: Normal heart sounds. No murmur heard.    No gallop.  Pulmonary:     Effort: Pulmonary effort is normal.     Breath sounds: Normal breath sounds.  Abdominal:     General: Bowel sounds are normal.     Palpations: Abdomen is soft.  Musculoskeletal:     Right lower leg: No edema.  Left lower leg: No edema.    Laboratory examination:   Recent Labs    04/14/22 0928 11/17/22 1330  NA 141 139  K 4.4 4.1  CL 107 104  CO2 27 25  GLUCOSE 102* 108*  BUN 20 22  CREATININE 0.62 0.70  CALCIUM 9.2 9.9  GFRNONAA  --  >60   CrCl cannot be calculated (Unknown ideal weight.).     Latest Ref Rng & Units 11/17/2022    1:30 PM 04/14/2022    9:28 AM 10/06/2021    9:56 AM  CMP  Glucose 70 - 99 mg/dL 108  102    BUN 8 - 23 mg/dL 22  20    Creatinine 0.44 - 1.00 mg/dL 0.70  0.62    Sodium 135 - 145 mmol/L 139  141    Potassium 3.5 - 5.1 mmol/L 4.1  4.4    Chloride 98 - 111 mmol/L 104  107    CO2 22 - 32 mmol/L 25  27    Calcium 8.9 - 10.3 mg/dL 9.9  9.2    Total Protein 6.5 - 8.1 g/dL 6.8  6.1  6.7   Total Bilirubin 0.3 - 1.2 mg/dL 0.7  0.4  0.5   Alkaline Phos 38 - 126 U/L 64     AST 15 - 41 U/L 22  18  15    ALT 0 - 44 U/L 15  15  11        Latest Ref Rng & Units 11/17/2022    1:30 PM  04/14/2022    9:28 AM 03/15/2020    9:08 AM  CBC  WBC 4.0 - 10.5 K/uL 9.3  5.0  6.1   Hemoglobin 12.0 - 15.0 g/dL 13.5  12.9  13.0   Hematocrit 36.0 - 46.0 % 41.7  38.7  40.1   Platelets 150 - 400 K/uL 282  220  253    Lipid Panel Recent Labs    04/14/22 0928  CHOL 155  TRIG 117  LDLCALC 71  HDL 64  CHOLHDL 2.4    HEMOGLOBIN A1C No results found for: "HGBA1C", "MPG" TSH Recent Labs    04/14/22 0928 11/17/22 1330  TSH 1.67 1.291   BNP (last 3 results) No results for input(s): "BNP" in the last 8760 hours. Cardiac Panel (last 3 results) Recent Labs    11/17/22 1330 11/17/22 1500  TROPONINIHS 34* 32*     Medications and allergies  No Known Allergies   No outpatient medications have been marked as taking for the 11/17/22 encounter [facility] Encounter).    Scheduled Meds:  aspirin  243 mg Oral Once   heparin  4,000 Units Intravenous Once   sodium chloride flush  3 mL Intravenous Q12H   Continuous Infusions:  heparin     PRN Meds:.   No intake/output data recorded. No intake/output data recorded.  Net IO Since Admission: No IO data has been entered for this period [11/17/22 1708]   Radiology:   Imaging results have been reviewed and DG Chest 1 View  Result Date: 11/17/2022 CLINICAL DATA:  Chest pain EXAM: CHEST  1 VIEW COMPARISON:  None Available. FINDINGS: Hyperinflation. No consolidation, pneumothorax or effusion. Calcified aorta. Normal cardiopericardial silhouette. Slight left basilar atelectasis or scar. There is a double density overlying the heart. Possible hiatal hernia. Surgical clips in the right upper quadrant. IMPRESSION: Hyperinflation.  Slight left basilar atelectasis or scar. Possible hiatal hernia. Electronically Signed   By: Jill Side M.D.   On: 11/17/2022 11:47  Cardiac Studies:   EKG:  11/17/2022: normal sinus rhythm with LVH, ST-T wave changes secondary to hypertrophy  Assessment & Recommendations:   Chest pain suggestive of unstable  angina pectoris, chest pain symptoms have been ongoing for the past 3 days on and off waxing and waning, activities did increase her chest discomfort.  Plan for diagnostic LHC with Dr Jacinto Halim tomorrow morning 11/18/2022  NPO after midnight  Maintain on telemetry  SL nitro for chest pain, if severe start nitro gtt  Troponin elevation suspect 2/2 NSTEMI type I  Continue to trend troponins  If symptoms worsen, please call cardiology  For now, continue heparin gtt  Start Toprol XL if no overt heart failure  Schedule for cardiac catheterization, and possible angioplasty. We discussed regarding risks, benefits, alternatives to this including stress testing, CTA and continued medical therapy. Patient wants to proceed. Understands <1-2% risk of death, stroke, MI, urgent CABG, bleeding, infection, renal failure but not limited to these.  I have personally examined the patient, discussed with the patient's daughter who is at the bedside regarding cardiac catheterization and risk and benefits of cardiac catheterization and she is willing to proceed.    Yates Decamp, MD, [facility] 11/17/2022, 11:11 PM Office: [phone] Fax: [phone] Pager: [phone]

## 2022-11-17 NOTE — Consult Note (Addendum)
CARDIOLOGY CONSULT NOTE  Patient ID: Linda Sosa MRN: [id number] DOB/AGE: 08-01-1946 77 y.o.  Admit date: 11/17/2022 Referring Physician  Dr Ophelia Charter Primary Physician:  Margaree Mackintosh, MD Reason for Consultation  chest pain  Patient ID: Linda Sosa, female    DOB: 1946/06/06, 77 y.o.   MRN: [id number]  Chief Complaint  Patient presents with   Chest Pain   Palpitations   Fatigue   HPI:    Linda Sosa  is a 77 y.o. female with past medical history significant for hypertension and hyperlipidemia who presented to the ED with chest pain. Patient has no known cardiac history and she has never seen a cardiologist in the past.   She came to the hospital due to chest pain, palpitations, and fatigue that has been worsening over the last few weeks. She has taken aspirin and it helped a little bit. Patient denies shortness of breath, diaphoresis, syncope, edema, PND, orthopnea.   Since coming to the hospital, patient states that her chest pain symptoms have essentially resolved.  She feels much more comfortable today.  Symptoms overall has been worse in the last 3 days with retrosternal chest tightness.  Past Medical History:  Diagnosis Date   Anemia    Hyperlipidemia    Hypertension    Osteoporosis    Vitamin D deficiency    Past Surgical History:  Procedure Laterality Date   BREAST EXCISIONAL BIOPSY Right 1991   BREAST EXCISIONAL BIOPSY Right 1971   LAPAROSCOPIC CHOLECYSTECTOMY  1997   TONSILLECTOMY  1952   Social History   Tobacco Use   Smoking status: Former    Packs/day: 1.00    Years: 5.00    Total pack years: 5.00    Types: Cigarettes    Quit date: 12/01/1983    Years since quitting: 38.9   Smokeless tobacco: Never  Substance Use Topics   Alcohol use: No    Family History  Problem Relation Age of Onset   Cancer Mother    Diabetes Father    Heart disease Father    Coronary artery disease Father 87   Coronary artery disease Brother 24   Colon cancer Neg  Hx    Stomach cancer Neg Hx    Breast cancer Neg Hx    Osteoporosis Neg Hx    Thyroid disease Neg Hx     Marital Status: Married  ROS  Review of Systems  Constitutional: Positive for malaise/fatigue.  Cardiovascular:  Positive for chest pain and palpitations.   Objective      11/17/2022    4:30 PM 11/17/2022    3:30 PM 11/17/2022    3:00 PM  Vitals with BMI  Systolic 118 125 782  Diastolic 61 56 59  Pulse 76 73 78    Blood pressure 118/61, pulse 76, temperature 98.8 F (37.1 C), resp. rate 17, SpO2 97 %.    Physical Exam Neck:     Vascular: No carotid bruit or JVD.  Cardiovascular:     Rate and Rhythm: Normal rate and regular rhythm.     Pulses: Intact distal pulses.     Heart sounds: Normal heart sounds. No murmur heard.    No gallop.  Pulmonary:     Effort: Pulmonary effort is normal.     Breath sounds: Normal breath sounds.  Abdominal:     General: Bowel sounds are normal.     Palpations: Abdomen is soft.  Musculoskeletal:     Right lower leg: No edema.  Left lower leg: No edema.    Laboratory examination:   Recent Labs    04/14/22 0928 11/17/22 1330  NA 141 139  K 4.4 4.1  CL 107 104  CO2 27 25  GLUCOSE 102* 108*  BUN 20 22  CREATININE 0.62 0.70  CALCIUM 9.2 9.9  GFRNONAA  --  >60   CrCl cannot be calculated (Unknown ideal weight.).     Latest Ref Rng & Units 11/17/2022    1:30 PM 04/14/2022    9:28 AM 10/06/2021    9:56 AM  CMP  Glucose 70 - 99 mg/dL 108  102    BUN 8 - 23 mg/dL 22  20    Creatinine 0.44 - 1.00 mg/dL 0.70  0.62    Sodium 135 - 145 mmol/L 139  141    Potassium 3.5 - 5.1 mmol/L 4.1  4.4    Chloride 98 - 111 mmol/L 104  107    CO2 22 - 32 mmol/L 25  27    Calcium 8.9 - 10.3 mg/dL 9.9  9.2    Total Protein 6.5 - 8.1 g/dL 6.8  6.1  6.7   Total Bilirubin 0.3 - 1.2 mg/dL 0.7  0.4  0.5   Alkaline Phos 38 - 126 U/L 64     AST 15 - 41 U/L 22  18  15    ALT 0 - 44 U/L 15  15  11        Latest Ref Rng & Units 11/17/2022    1:30 PM  04/14/2022    9:28 AM 03/15/2020    9:08 AM  CBC  WBC 4.0 - 10.5 K/uL 9.3  5.0  6.1   Hemoglobin 12.0 - 15.0 g/dL 13.5  12.9  13.0   Hematocrit 36.0 - 46.0 % 41.7  38.7  40.1   Platelets 150 - 400 K/uL 282  220  253    Lipid Panel Recent Labs    04/14/22 0928  CHOL 155  TRIG 117  LDLCALC 71  HDL 64  CHOLHDL 2.4    HEMOGLOBIN A1C No results found for: "HGBA1C", "MPG" TSH Recent Labs    04/14/22 0928 11/17/22 1330  TSH 1.67 1.291   BNP (last 3 results) No results for input(s): "BNP" in the last 8760 hours. Cardiac Panel (last 3 results) Recent Labs    11/17/22 1330 11/17/22 1500  TROPONINIHS 34* 32*     Medications and allergies  No Known Allergies   No outpatient medications have been marked as taking for the 11/17/22 encounter [facility] Encounter).    Scheduled Meds:  aspirin  243 mg Oral Once   heparin  4,000 Units Intravenous Once   sodium chloride flush  3 mL Intravenous Q12H   Continuous Infusions:  heparin     PRN Meds:.   No intake/output data recorded. No intake/output data recorded.  Net IO Since Admission: No IO data has been entered for this period [11/17/22 1708]   Radiology:   Imaging results have been reviewed and DG Chest 1 View  Result Date: 11/17/2022 CLINICAL DATA:  Chest pain EXAM: CHEST  1 VIEW COMPARISON:  None Available. FINDINGS: Hyperinflation. No consolidation, pneumothorax or effusion. Calcified aorta. Normal cardiopericardial silhouette. Slight left basilar atelectasis or scar. There is a double density overlying the heart. Possible hiatal hernia. Surgical clips in the right upper quadrant. IMPRESSION: Hyperinflation.  Slight left basilar atelectasis or scar. Possible hiatal hernia. Electronically Signed   By: Jill Side M.D.   On: 11/17/2022 11:47  Cardiac Studies:   EKG:  11/17/2022: normal sinus rhythm with LVH, ST-T wave changes secondary to hypertrophy  Assessment & Recommendations:   Chest pain suggestive of unstable  angina pectoris, chest pain symptoms have been ongoing for the past 3 days on and off waxing and waning, activities did increase her chest discomfort.  Plan for diagnostic LHC with Dr Jacinto Halim tomorrow morning 11/18/2022  NPO after midnight  Maintain on telemetry  SL nitro for chest pain, if severe start nitro gtt  Troponin elevation suspect 2/2 NSTEMI type I  Continue to trend troponins  If symptoms worsen, please call cardiology  For now, continue heparin gtt  Start Toprol XL if no overt heart failure  Schedule for cardiac catheterization, and possible angioplasty. We discussed regarding risks, benefits, alternatives to this including stress testing, CTA and continued medical therapy. Patient wants to proceed. Understands <1-2% risk of death, stroke, MI, urgent CABG, bleeding, infection, renal failure but not limited to these.  I have personally examined the patient, discussed with the patient's daughter who is at the bedside regarding cardiac catheterization and risk and benefits of cardiac catheterization and she is willing to proceed.    Yates Decamp, MD, [facility] 11/17/2022, 11:11 PM Office: [phone] Fax: [phone] Pager: [phone]

## 2022-11-17 NOTE — Progress Notes (Signed)
ANTICOAGULATION CONSULT NOTE - Initial Consult  Pharmacy Consult for heparin  Indication: chest pain/ACS  No Known Allergies   Vital Signs: Temp: 98.8 F (37.1 C) (01/09 1243) Temp Source: Oral (01/09 1102) BP: 118/61 (01/09 1630) Pulse Rate: 76 (01/09 1630)  Labs: Recent Labs    11/17/22 1330 11/17/22 1500  HGB 13.5  --   HCT 41.7  --   PLT 282  --   CREATININE 0.70  --   TROPONINIHS 34* 32*    CrCl cannot be calculated (Unknown ideal weight.).   Medical History: Past Medical History:  Diagnosis Date   Anemia    Hyperlipidemia    Hypertension    Osteoporosis    Vitamin D deficiency     Assessment: Patient reports to ED with palpitations and chest heaviness. No AC PTA. Patient reported weight 75 kg. Pharmacy consulted to dose heparin.   Patient with plan for cath tomorrow 1/9.   Goal of Therapy:  Heparin level 0.3-0.7 units/ml Monitor platelets by anticoagulation protocol: Yes   Plan:  Give heparin 4000 units IV x1  Start heparin infusion at 1000 units/hr  Will f/u heparin level in 8 hours Monitor daily heparin level and CBC Continue to monitor H&H   F/u plan for cath    Gena Fray, PharmD PGY1 Pharmacy Resident   11/17/2022 5:24 PM

## 2022-11-17 NOTE — H&P (Signed)
History and Physical    Patient: Linda Sosa [id number] DOB: 28-Mar-1946 DOA: 11/17/2022 DOS: the patient was seen and examined on 11/17/2022 PCP: Elby Showers, MD  Patient coming from: Home - lives alone; NOK: Daughter, Anderson Malta, [phone]   Chief Complaint: Palpitationse  HPI: Linda Sosa is a 77 y.o. female with medical history significant of anemia and HLD presenting with palpitations.   She reports that she had a long spell of palpitations on Saturday afternoon and felt tired Saturday and Sunday was noticing chest heaviness.  She felt better at lunchtime but recurred in the afternoon.  Yesterday, the heaviness was still there but palps were gone.  She seems to feel some better in the ER.  No prior h/o similar.  Does not wear an Apple Watch.  No SOB.    ER Course:  Chest pressure today.  EKG ok.  Troponin 34, 32.  Cardiology recommended heparin and will consult.     Review of Systems: As mentioned in the history of present illness. All other systems reviewed and are negative. Past Medical History:  Diagnosis Date   Anemia    Hyperlipidemia    Hypertension    Osteoporosis    Vitamin D deficiency    Past Surgical History:  Procedure Laterality Date   BREAST EXCISIONAL BIOPSY Right 1991   BREAST EXCISIONAL BIOPSY Right 1971   [place]   Social History:  reports that she quit smoking about 38 years ago. Her smoking use included cigarettes. She has a 5.00 pack-year smoking history. She has never used smokeless tobacco. She reports that she does not drink alcohol and does not use drugs.  No Known Allergies  Family History  Problem Relation Age of Onset   Cancer Mother    Diabetes Father    Heart disease Father    Coronary artery disease Father 71   Coronary artery disease Brother 85   Colon cancer Neg Hx    Stomach cancer Neg Hx    Breast cancer Neg Hx    Osteoporosis Neg Hx    Thyroid disease Neg Hx      Prior to Admission medications   Medication Sig Start Date End Date Taking? Authorizing Provider  aspirin 81 MG tablet Take 81 mg by mouth daily.    [provider]  calcium carbonate (OS-CAL) 600 MG TABS Take 600 mg by mouth 2 (two) times daily with a meal.      [provider]  Cholecalciferol (VITAMIN D) 2000 UNITS CAPS Take by mouth.      [provider]  dicyclomine (BENTYL) 20 MG tablet ONE TAB ONE HALF HOUR BEFORE LUNCH AND DINNER FOR IRRITABLE BOWEL SYMPTOMS 01/13/22   Elby Showers, MD  ferrous sulfate 325 (65 FE) MG tablet Take 325 mg by mouth 2 (two) times daily.    [provider]  HYDROcodone-acetaminophen (NORCO/VICODIN) 5-325 MG tablet Take 1 tablet by mouth every 6 (six) hours as needed for moderate pain or severe pain. 07/09/22   Vanessa Kick, MD  losartan (COZAAR) 100 MG tablet TAKE 1 TABLET BY MOUTH EVERY DAY 08/03/22   Elby Showers, MD  Multiple Vitamins-Minerals (MULTIVITAMIN WITH MINERALS) tablet Take 1 tablet by mouth daily.      [provider]  ondansetron (ZOFRAN) 4 MG tablet Take 1 tablet (4 mg total) by mouth every 8 (eight) hours as needed for nausea or vomiting. 03/21/21   Baxley, Cresenciano Lick, MD  simvastatin (ZOCOR) 20 MG tablet TAKE 1 TABLET BY MOUTH EVERYDAY AT BEDTIME 05/15/22   Margaree Mackintosh, MD    Physical Exam: Vitals:   11/17/22 1430 11/17/22 1500 11/17/22 1530 11/17/22 1630  BP: (!) 121/56 (!) 135/59 (!) 125/56 118/61  Pulse: 73 78 73 76  Resp: 16 18 15 17   Temp:      TempSrc:      SpO2: 97% 98% 97% 97%   General:  Appears calm and comfortable and is in NAD Eyes:   EOMI, normal lids, iris ENT:  grossly normal hearing, lips & tongue, mmm Neck:  no LAD, masses or thyromegaly Cardiovascular:  RRR, no m/r/g. No LE edema.  Respiratory:   CTA bilaterally with no wheezes/rales/rhonchi.  Normal respiratory effort. Abdomen:  soft, NT, ND Skin:  no rash or induration seen on limited exam Musculoskeletal:  grossly  normal tone BUE/BLE, good ROM, no bony abnormality Psychiatric:  grossly normal mood and affect, speech fluent and appropriate, AOx3 Neurologic:  CN 2-12 grossly intact, moves all extremities in coordinated fashion   Radiological Exams on Admission: Independently reviewed - see discussion in A/P where applicable  DG Chest 1 View  Result Date: 11/17/2022 CLINICAL DATA:  Chest pain EXAM: CHEST  1 VIEW COMPARISON:  None Available. FINDINGS: Hyperinflation. No consolidation, pneumothorax or effusion. Calcified aorta. Normal cardiopericardial silhouette. Slight left basilar atelectasis or scar. There is a double density overlying the heart. Possible hiatal hernia. Surgical clips in the right upper quadrant. IMPRESSION: Hyperinflation.  Slight left basilar atelectasis or scar. Possible hiatal hernia. Electronically Signed   By: 01/16/2023 M.D.   On: 11/17/2022 11:47    EKG: Independently reviewed.  Sinus tachycardia with rate 101; nonspecific ST changes with no evidence of acute ischemia   Labs on Admission: I have personally reviewed the available labs and imaging studies at the time of the admission.  Pertinent labs:    Glucose 108 HS troponin 34, 32 Normal CBC TSH 1.291 COVID/flu/RSV negative   Assessment and Plan: Principal Problem:   Chest pain Active Problems:   Hyperlipidemia   Essential hypertension, benign   DNR (do not resuscitate)    Chest pain/palpitations -Patient with substernal chest pressure and palpitations over the last few days -She describes palps as a hard beating in her chest -Chest pressure has slowly improved spontaneously -CXR unremarkable.   -Initial cardiac HS troponin minimally elevated with negative delta.  -EKG not indicative of acute ischemia.   -Will plan to place in observation status on telemetry to rule out ACS by overnight observation.  -She has been started by heparin infusion at the request of cardiology -Risk factor stratification with  HgbA1c and FLP; will also check TSH  -Cardiology consultation -Echo ordered -Cardiology appears to be planning for catheterization tomorrow  HTN -Continue losartan -Will add metoprolol -Will also add prn hydralazine  HLD -Continue statin but will change simvastatin to atorvastatin 40 mg daily -Check lipids  DNR -I have discussed code status with the patient and her daughter and  they are in agreement that the patient would not desire resuscitation and would prefer to die a natural death should that situation arise. -She will need a gold out of facility DNR form at the time of discharge      Advance Care Planning:   Code Status: DNR   Consults: Cardiology  DVT Prophylaxis: Heparin drip  Family Communication: Daughter was present throughout evaluation  Severity of Illness: The appropriate patient status for this patient is  OBSERVATION. Observation status is judged to be reasonable and necessary in order to provide the required intensity of service to ensure the patient's safety. The patient's presenting symptoms, physical exam findings, and initial radiographic and laboratory data in the context of their medical condition is felt to place them at decreased risk for further clinical deterioration. Furthermore, it is anticipated that the patient will be medically stable for discharge from the hospital within 2 midnights of admission.   Author: Karmen Bongo, MD 11/17/2022 5:31 PM  For on call review [email or web address].

## 2022-11-17 NOTE — ED Triage Notes (Signed)
Pt. Stated, Ive had palpitations and chest heaviness with being tired. This started on Saturday.

## 2022-11-17 NOTE — Telephone Encounter (Signed)
Phone call from patient complaining of chest heaviness and tachycardia.  Initially had onset on Saturday, January 6 but terminated spontaneously and now has recurrence.  She resides alone and is worried.  She asked if we should have her transported by EMS and I concur that she needs to travel to the emergency department immediately by EMS.  She has history of hypertension and hyperlipidemia.  She takes aspirin 81 mg daily.  History of osteoporosis.  Used to receive Prolia injections.  History of anxiety.  History of irritable bowel syndrome treated with Bentyl. MJB, MD

## 2022-11-18 ENCOUNTER — Encounter (HOSPITAL_COMMUNITY): Payer: Self-pay | Admitting: Cardiology

## 2022-11-18 ENCOUNTER — Encounter (HOSPITAL_COMMUNITY)
Admission: EM | Disposition: A | Discharge status: Home / Self Care | Payer: Self-pay | Source: Home / Self Care | Attending: Emergency Medicine

## 2022-11-18 ENCOUNTER — Observation Stay (HOSPITAL_COMMUNITY): Payer: Medicare Other

## 2022-11-18 DIAGNOSIS — R002 Palpitations: Secondary | ICD-10-CM

## 2022-11-18 DIAGNOSIS — R748 Abnormal levels of other serum enzymes: Secondary | ICD-10-CM

## 2022-11-18 HISTORY — PX: LEFT HEART CATH AND CORONARY ANGIOGRAPHY: CATH118249

## 2022-11-18 LAB — ECHOCARDIOGRAM COMPLETE
AR max vel: 2.12 cm2
AV Area VTI: 2.12 cm2
AV Area mean vel: 2.15 cm2
AV Mean grad: 4 mmHg
AV Peak grad: 8.1 mmHg
Ao pk vel: 1.42 m/s
Area-P 1/2: 3.63 cm2
Height: 62 in
S' Lateral: 2.8 cm
Weight: 2640 oz

## 2022-11-18 LAB — CBC
HCT: 38.1 % (ref 36.0–46.0)
Hemoglobin: 12.4 g/dL (ref 12.0–15.0)
MCH: 28.8 pg (ref 26.0–34.0)
MCHC: 32.5 g/dL (ref 30.0–36.0)
MCV: 88.4 fL (ref 80.0–100.0)
Platelets: 252 10*3/uL (ref 150–400)
RBC: 4.31 MIL/uL (ref 3.87–5.11)
RDW: 13.5 % (ref 11.5–15.5)
WBC: 7.1 10*3/uL (ref 4.0–10.5)
nRBC: 0 % (ref 0.0–0.2)

## 2022-11-18 LAB — RENAL FUNCTION PANEL
Albumin: 3.6 g/dL (ref 3.5–5.0)
Anion gap: 11 (ref 5–15)
BUN: 25 mg/dL — ABNORMAL HIGH (ref 8–23)
CO2: 25 mmol/L (ref 22–32)
Calcium: 9.3 mg/dL (ref 8.9–10.3)
Chloride: 102 mmol/L (ref 98–111)
Creatinine, Ser: 0.66 mg/dL (ref 0.44–1.00)
GFR, Estimated: >60 mL/min (ref >60)
Glucose, Bld: 100 mg/dL — ABNORMAL HIGH (ref 70–99)
Phosphorus: 3.6 mg/dL (ref 2.5–4.6)
Potassium: 3.9 mmol/L (ref 3.5–5.1)
Sodium: 138 mmol/L (ref 135–145)

## 2022-11-18 LAB — LIPID PANEL
Cholesterol: 156 mg/dL (ref 0–200)
HDL: 56 mg/dL (ref >40)
LDL Cholesterol: 82 mg/dL (ref 0–99)
Total CHOL/HDL Ratio: 2.8 RATIO
Triglycerides: 88 mg/dL (ref <150)
VLDL: 18 mg/dL (ref 0–40)

## 2022-11-18 LAB — HEMOGLOBIN A1C
Hgb A1c MFr Bld: 5.8 % — ABNORMAL HIGH (ref 4.8–5.6)
Mean Plasma Glucose: 119.76 mg/dL

## 2022-11-18 LAB — HEPARIN LEVEL (UNFRACTIONATED)
Heparin Unfractionated: 0.53 IU/mL (ref 0.30–0.70)
Heparin Unfractionated: 0.62 IU/mL (ref 0.30–0.70)

## 2022-11-18 LAB — MAGNESIUM: Magnesium: 2 mg/dL (ref 1.7–2.4)

## 2022-11-18 SURGERY — LEFT HEART CATH AND CORONARY ANGIOGRAPHY
Anesthesia: LOCAL

## 2022-11-18 MED ORDER — SODIUM CHLORIDE 0.9 % WEIGHT BASED INFUSION: 1.0000 mL/kg/h | INTRAVENOUS | Status: DC

## 2022-11-18 MED ORDER — HEPARIN SODIUM (PORCINE) 1000 UNIT/ML IJ SOLN
INTRAMUSCULAR | Status: DC | PRN
  Administered 2022-11-18: 4000 [IU] via INTRAVENOUS

## 2022-11-18 MED ORDER — LIDOCAINE HCL (PF) 1 % IJ SOLN
INTRAMUSCULAR | Status: AC
  Filled 2022-11-18: qty 30

## 2022-11-18 MED ORDER — SODIUM CHLORIDE 0.9 % IV SOLN: 250.0000 mL | INTRAVENOUS | Status: DC | PRN

## 2022-11-18 MED ORDER — HEPARIN SODIUM (PORCINE) 1000 UNIT/ML IJ SOLN
INTRAMUSCULAR | Status: AC
  Filled 2022-11-18: qty 10

## 2022-11-18 MED ORDER — OMEPRAZOLE 20 MG PO CPDR: 20.0000 mg | DELAYED_RELEASE_CAPSULE | Freq: Every day | ORAL | 2 refills | Status: DC

## 2022-11-18 MED ORDER — ASPIRIN 81 MG PO CHEW
81.0000 mg | CHEWABLE_TABLET | ORAL | Status: AC
  Administered 2022-11-18: 81 mg via ORAL
  Filled 2022-11-18: qty 1

## 2022-11-18 MED ORDER — MIDAZOLAM HCL 2 MG/2ML IJ SOLN
INTRAMUSCULAR | Status: DC | PRN
  Administered 2022-11-18: 1 mg via INTRAVENOUS

## 2022-11-18 MED ORDER — IOHEXOL 350 MG/ML SOLN
INTRAVENOUS | Status: DC | PRN
  Administered 2022-11-18: 60 mL

## 2022-11-18 MED ORDER — VERAPAMIL HCL 2.5 MG/ML IV SOLN
INTRAVENOUS | Status: DC | PRN
  Administered 2022-11-18: 10 mL via INTRA_ARTERIAL

## 2022-11-18 MED ORDER — METOPROLOL SUCCINATE ER 25 MG PO TB24: 25.0000 mg | ORAL_TABLET | Freq: Every day | ORAL | 2 refills | Status: DC

## 2022-11-18 MED ORDER — SODIUM CHLORIDE 0.9 % WEIGHT BASED INFUSION
1.0000 mL/kg/h | INTRAVENOUS | Status: DC
  Administered 2022-11-18: 1 mL/kg/h via INTRAVENOUS

## 2022-11-18 MED ORDER — VERAPAMIL HCL 2.5 MG/ML IV SOLN
INTRAVENOUS | Status: AC
  Filled 2022-11-18: qty 2

## 2022-11-18 MED ORDER — HEPARIN (PORCINE) IN NACL 1000-0.9 UT/500ML-% IV SOLN
INTRAVENOUS | Status: AC
  Filled 2022-11-18: qty 500

## 2022-11-18 MED ORDER — MIDAZOLAM HCL 2 MG/2ML IJ SOLN
INTRAMUSCULAR | Status: AC
  Filled 2022-11-18: qty 2

## 2022-11-18 MED ORDER — LIDOCAINE HCL (PF) 1 % IJ SOLN
INTRAMUSCULAR | Status: DC | PRN
  Administered 2022-11-18: 2 mL

## 2022-11-18 MED ORDER — SODIUM CHLORIDE 0.9% FLUSH: 3.0000 mL | INTRAVENOUS | Status: DC | PRN

## 2022-11-18 MED ORDER — ASPIRIN 81 MG PO CHEW: 81.0000 mg | CHEWABLE_TABLET | ORAL | Status: DC

## 2022-11-18 MED ORDER — SODIUM CHLORIDE 0.9 % WEIGHT BASED INFUSION
3.0000 mL/kg/h | INTRAVENOUS | Status: AC
  Administered 2022-11-18: 3 mL/kg/h via INTRAVENOUS

## 2022-11-18 MED ORDER — HEPARIN (PORCINE) IN NACL 1000-0.9 UT/500ML-% IV SOLN
INTRAVENOUS | Status: DC | PRN
  Administered 2022-11-18 (×2): 500 mL

## 2022-11-18 MED ORDER — SODIUM CHLORIDE 0.9% FLUSH: 3.0000 mL | Freq: Two times a day (BID) | INTRAVENOUS | Status: DC

## 2022-11-18 SURGICAL SUPPLY — 14 items
CATH 5FR JL3.5 JR4 ANG PIG MP (CATHETERS) IMPLANT
CATH INFINITI 5FR AL1 (CATHETERS) IMPLANT
CATH OPTITORQUE TIG 4.0 5F (CATHETERS) IMPLANT
DEVICE RAD COMP TR BAND LRG (VASCULAR PRODUCTS) IMPLANT
GLIDESHEATH SLEND A-KIT 6F 22G (SHEATH) IMPLANT
GUIDEWIRE INQWIRE 1.5J.035X260 (WIRE) IMPLANT
INQWIRE 1.5J .035X260CM (WIRE) ×1
KIT HEART LEFT (KITS) ×2 IMPLANT
PACK CARDIAC CATHETERIZATION (CUSTOM PROCEDURE TRAY) ×2 IMPLANT
PROTECTION STATION PRESSURIZED (MISCELLANEOUS) ×1
SHEATH PROBE COVER 6X72 (BAG) IMPLANT
STATION PROTECTION PRESSURIZED (MISCELLANEOUS) IMPLANT
TRANSDUCER W/STOPCOCK (MISCELLANEOUS) ×2 IMPLANT
TUBING CIL FLEX 10 FLL-RA (TUBING) ×2 IMPLANT

## 2022-11-18 NOTE — Discharge Summary (Signed)
Physician Discharge Summary  Patient ID: Linda Sosa MRN: [id number] DOB/AGE: Apr 28, 1946 77 y.o. Linda Showers, MD   Admit date: 11/17/2022 Discharge date: 11/18/2022  Primary Discharge Diagnosis Precordial pain probably due to GERD and esophageal spasm patient started on PPI. Type II MI, minimal elevation in serum troponin. Primary hypertension well-controlled.  No changes were done with medications. Palpitations suggest PACs and PVCs.  Patient was started on metoprolol succinate.  Significant Diagnostic Studies:  EKG 11/18/2022: Normal sinus rhythm/sinus tachycardia at rate of 111 bpm, voltage criteria for LVH in aVL.  No evidence of ischemia, normal QT interval.  Left Heart Catheterization 11/18/22:  LV: 1 and 26/3, EDP 11 mmHg.  Ao 132/59, mean 93 mmHg.  No pressure gradient across the aortic valve. RCA: Nondominant, small and normal. LM: Large-caliber vessel, smooth and normal. LAD: Large-caliber vessel, gives origin to 2 moderate to large size D1 and D2, proximal to mid segment of LAD has mild calcific 20 to 30% stenosis. LCx: Dominant.  Smooth and normal.      Impression: Chest pain noncardiac etiology, probably related to esophageal spasm and GERD.  Minimally elevated troponin nonspecific at 30.0.  No indication for non-STEMI.  Patient will be discharged home today.  She will follow-up with her PCP. Patient was started on omeprazole for possible GERD, metoprolol succinate 25 mg daily for palpitations, if symptoms of palpitation persist, she can contact us for further evaluation.  Echocardiogram 11/18/2022:   1. Left ventricular ejection fraction, by estimation, is 60 to 65%. The left ventricle has normal function. The left ventricle has no regional wall motion abnormalities. Left ventricular diastolic parameters were normal.  2. Right ventricular systolic function is normal. The right ventricular size is normal. Tricuspid regurgitation signal is inadequate for assessing PA  pressure.  3. The mitral valve is normal in structure. Trivial mitral valve regurgitation. No evidence of mitral stenosis.  4. The aortic valve is tricuspid. Aortic valve regurgitation is not visualized. Aortic valve sclerosis/calcification is present, without any evidence of aortic stenosis.  5. The inferior vena cava is normal in size with greater than 50% respiratory variability, suggesting right atrial pressure of 3 mmHg.    Radiology: DG Chest 1 View  Result Date: 11/17/2022 CLINICAL DATA:  Chest pain EXAM: CHEST  1 VIEW COMPARISON:  None Available. FINDINGS: Hyperinflation. No consolidation, pneumothorax or effusion. Calcified aorta. Normal cardiopericardial silhouette. Slight left basilar atelectasis or scar. There is a double density overlying the heart. Possible hiatal hernia. Surgical clips in the right upper quadrant. IMPRESSION: Hyperinflation.  Slight left basilar atelectasis or scar. Possible hiatal hernia. Electronically Signed   By: Jill Side M.D.   On: 11/17/2022 11:47    Hospital Course: Linda Sosa is a 77 y.o. female  patient with hypertension, hyperlipidemia admitted via emergency room with chest pain ongoing for the past 3 days.  EKG was nonischemic however serum troponin was minimally elevated at 30, in view of ongoing chest pain, her cardiac risk factors including age, hypertension hyperlipidemia she was admitted to the hospital for observation following which she underwent cardiac catheterization the following day revealing no significant coronary disease except minimal calcific 20% stenosis in the proximal to mid segment of LAD.  Echocardiogram did not reveal any significant valvular abnormalities, there was no pericardial effusion.  Her symptoms of chest tightness, precordial pain was felt to be probably due to GERD.  And is stable for discharge from cardiac standpoint with follow-up with her PCP.  She also complained of  palpitations ongoing for the past 4 to 5  days, suspect these are PACs and PVCs.  Patient was started on metoprolol succinate 25 mg daily, unless symptoms persist then she can follow-up with cardiology with outpatient EKG monitoring for further management otherwise can follow-up with her PCP Dr. Tommie Ard Baxley.  Recommendations on discharge: Patient started on PPI, omeprazole 20 mg daily, aspirin discontinued, metoprolol succinate started for palpitations.  To follow-up with PCP, if palpitations symptoms persist to call cardiology for further management.  I contacted patient's daughter and updated her.  Discharge Exam:    11/18/2022   10:05 AM 11/18/2022    9:51 AM 11/18/2022    9:46 AM  Vitals with BMI  Systolic 034 742 595  Diastolic 60 58 53  Pulse 75 0 84     Physical Exam Neck:     Vascular: No carotid bruit or JVD.  Cardiovascular:     Rate and Rhythm: Normal rate and regular rhythm.     Pulses: Intact distal pulses.     Heart sounds: Normal heart sounds. No murmur heard.    No gallop.  Pulmonary:     Effort: Pulmonary effort is normal.     Breath sounds: Normal breath sounds.  Abdominal:     General: Bowel sounds are normal.     Palpations: Abdomen is soft.  Musculoskeletal:     Right lower leg: No edema.     Left lower leg: No edema.     Labs:   Lab Results  Component Value Date   WBC 7.1 11/18/2022   HGB 12.4 11/18/2022   HCT 38.1 11/18/2022   MCV 88.4 11/18/2022   PLT 252 11/18/2022    Recent Labs  Lab 11/17/22 1330 11/18/22 0438  NA 139 138  K 4.1 3.9  CL 104 102  CO2 25 25  BUN 22 25*  CREATININE 0.70 0.66  CALCIUM 9.9 9.3  PROT 6.8  --   BILITOT 0.7  --   ALKPHOS 64  --   ALT 15  --   AST 22  --   GLUCOSE 108* 100*    Lipid Panel     Component Value Date/Time   CHOL 156 11/18/2022 0438   TRIG 88 11/18/2022 0438   HDL 56 11/18/2022 0438   CHOLHDL 2.8 11/18/2022 0438   VLDL 18 11/18/2022 0438   LDLCALC 82 11/18/2022 0438   LDLCALC 71 04/14/2022 0928    BNP (last 3  results) No results for input(s): "BNP" in the last 8760 hours.  HEMOGLOBIN A1C Lab Results  Component Value Date   HGBA1C 5.8 (H) 11/18/2022   MPG 119.76 11/18/2022    Cardiac Panel (last 3 results) Recent Labs    11/17/22 1330 11/17/22 1500  TROPONINIHS 34* 32*     TSH Recent Labs    04/14/22 0928 11/17/22 1330  TSH 1.67 1.291    FOLLOW UP PLANS AND APPOINTMENTS  Allergies as of 11/18/2022   No Known Allergies      Medication List     STOP taking these medications    aspirin 81 MG tablet       TAKE these medications    calcium carbonate 600 MG Tabs tablet Commonly known as: OS-CAL Take 600 mg by mouth in the morning.   ferrous sulfate 325 (65 FE) MG tablet Take 325 mg by mouth in the morning.   losartan 100 MG tablet Commonly known as: COZAAR TAKE 1 TABLET BY MOUTH EVERY DAY What changed: when  to take this   metoprolol succinate 25 MG 24 hr tablet Commonly known as: TOPROL-XL Take 1 tablet (25 mg total) by mouth daily. Take with or immediately following a meal.   multivitamin with minerals tablet Take 1 tablet by mouth in the morning.   omeprazole 20 MG capsule Commonly known as: PRILOSEC Take 1 capsule (20 mg total) by mouth daily before breakfast.   simvastatin 20 MG tablet Commonly known as: ZOCOR TAKE 1 TABLET BY MOUTH EVERYDAY AT BEDTIME What changed: See the new instructions.   TYLENOL PO Take 2 tablets by mouth daily as needed (pain, fever, headache).   VITAMIN D-3 PO Take 1 capsule by mouth in the morning.        Follow-up Information     Baxley, Luanna Cole, MD. Schedule an appointment as soon as possible for a visit in 2 day(s).   Specialty: Internal Medicine Contact information: [address] [place] [address] [phone]                  Yates Decamp, MD, [facility] 11/18/2022, 10:25 AM Office: [phone]

## 2022-11-18 NOTE — Progress Notes (Signed)
ANTICOAGULATION CONSULT NOTE - Follow Up Consult  Pharmacy Consult for heparin Indication: chest pain/ACS  Labs: Recent Labs    11/17/22 1330 11/17/22 1500 11/18/22 0058  HGB 13.5  --   --   HCT 41.7  --   --   PLT 282  --   --   HEPARINUNFRC  --   --  0.53  CREATININE 0.70  --   --   TROPONINIHS 34* 32*  --     Assessment/Plan:  77yo female therapeutic on heparin with initial dosing for CP. Will continue infusion at current rate of 1000 units/hr and confirm stable with additional level.   Wynona Neat, PharmD, BCPS  11/18/2022,2:04 AM

## 2022-11-18 NOTE — Discharge Instructions (Signed)

## 2022-11-18 NOTE — Progress Notes (Signed)
Patient and daughter was given discharge instructions. Both verbalized understanding. 

## 2022-11-18 NOTE — Interval H&P Note (Signed)
History and Physical Interval Note:  11/18/2022 9:15 AM  Linda Sosa  has presented today for surgery, with the diagnosis of unstable angina.  The various methods of treatment have been discussed with the patient and family. After consideration of risks, benefits and other options for treatment, the patient has consented to  Procedure(s): LEFT HEART CATH AND CORONARY ANGIOGRAPHY (N/A) and possible angioplasty as a surgical intervention.  The patient's history has been reviewed, patient examined, no change in status, stable for surgery.  I have reviewed the patient's chart and labs.  Questions were answered to the patient's satisfaction.   Cath Lab Visit (complete for each Cath Lab visit)  Clinical Evaluation Leading to the Procedure:   ACS: Yes.    Non-ACS:    Anginal Classification: CCS IV  Anti-ischemic medical therapy: Minimal Therapy (1 class of medications)  Non-Invasive Test Results: No non-invasive testing performed  Prior CABG: No previous CABG    Adrian Prows

## 2022-11-18 NOTE — Progress Notes (Signed)
Echocardiogram 2D Echocardiogram has been performed.  Linda Sosa 11/18/2022, 8:52 AM

## 2022-11-19 ENCOUNTER — Telehealth: Payer: Self-pay

## 2022-11-19 ENCOUNTER — Telehealth: Payer: Self-pay | Admitting: Internal Medicine

## 2022-11-19 NOTE — Telephone Encounter (Signed)
Transition Care Management Unsuccessful Follow-up Telephone Call  Date of discharge and from where:  11/18/21   Attempts:  1st Attempt  Reason for unsuccessful TCM follow-up call:  Left voice message

## 2022-11-19 NOTE — Telephone Encounter (Signed)
Saje called to say she had been D/C yesterday from hospital after having Heart Craterization and she was to follow up in 2 days with Dr Renold Genta. I have scheduled her for Friday 11/20/2022.  Will you call and do TOC

## 2022-11-20 ENCOUNTER — Ambulatory Visit (INDEPENDENT_AMBULATORY_CARE_PROVIDER_SITE_OTHER): Payer: Medicare Other | Admitting: Internal Medicine

## 2022-11-20 ENCOUNTER — Encounter: Payer: Self-pay | Admitting: Internal Medicine

## 2022-11-20 VITALS — BP 128/76 | HR 83 | Temp 98.3°F | Ht 62.0 in | Wt 157.4 lb

## 2022-11-20 DIAGNOSIS — I10 Essential (primary) hypertension: Secondary | ICD-10-CM

## 2022-11-20 DIAGNOSIS — K219 Gastro-esophageal reflux disease without esophagitis: Secondary | ICD-10-CM

## 2022-11-20 DIAGNOSIS — R002 Palpitations: Secondary | ICD-10-CM | POA: Diagnosis not present

## 2022-11-20 DIAGNOSIS — Z8781 Personal history of (healed) traumatic fracture: Secondary | ICD-10-CM | POA: Diagnosis not present

## 2022-11-20 DIAGNOSIS — M818 Other osteoporosis without current pathological fracture: Secondary | ICD-10-CM

## 2022-11-20 DIAGNOSIS — E781 Pure hyperglyceridemia: Secondary | ICD-10-CM

## 2022-11-20 NOTE — Telephone Encounter (Signed)
Attempted to call patient on 11/19/22 no answer, left voicemail.

## 2022-11-20 NOTE — Progress Notes (Signed)
   Subjective:    Patient ID: Linda Sosa, female    DOB: 08-Jun-1946, 77 y.o.   MRN: [id number]  HPI 77 year old Female seen for hospitalization follow up.  Patient called on January 9 complaining of chest heaviness and tachycardia.  Initially symptoms started Saturday, January 6 but terminated spontaneously and at the time of her call she had had recurrence of symptoms.  She resides alone and is worried.  She asked about EMS transport and we agreed with that.  She has a history of hypertension and hyperlipidemia.  She has osteoporosis and anxiety.  Has irritable bowel syndrome treated with Bentyl.  She was admitted to the hospital on telemetry.  Past medical history: History of hypertension and whitecoat syndrome.    History of hypertriglyceridemia.  History of osteoporosis previously treated with Fosamax and Prolia but not recently.  Last bone density study was in 2020 and T-score was -2.8.  In August 2023 she suffered a fall and had a closed displaced fracture of the surgical neck of left humerus.  Social history: She is a widow.  Husband passed away from complications of non-Hodgkin's lymphoma.  She stays at home and enjoys gardening.  Does not smoke or consume alcohol.  1 daughter.  Patient was seen by Dr. Einar Gip and had left heart catheterization November 18, 2022.  She had minimally elevated troponin HS at 34.  EKG did not show ST-T wave elevation.  Coronary catheterization showed minimal calcific 20% stenosis in the proximal to mid segment of the LAD.  Echocardiogram did not reveal any valvular abnormalities and there was no pericardial effusion.  Subsequently it was felt that her chest tightness and pain might be related to GE reflux and she was placed on PPI.  Patient was started on metoprolol for palpitations 25 mg daily.  She feels well now and is in no acute distress.  Hemoglobin A1c in the hospital was 5.8%.  Review of Systems no new complaints.  No further episodes of  palpitations.     Objective:   Physical Exam Vital signs reviewed.  Blood pressure 128/76 pulse 83 temperature 98.3 degrees pulse oximetry 97% weight 157 pounds 6.4 ounces BMI 28.79 Neck is supple without JVD thyromegaly or carotid bruits.  Chest clear to auscultation without rales or wheezing.  Cardiac exam: Regular rate and rhythm without ectopy or murmur.  No lower extremity pitting edema.  Affect thought and judgment appear to be normal      Assessment & Plan:   Palpitations-resolved during hospitalization after being started on metoprolol.  Heart cath showed no significant stenosis of the coronary vessels.  Perhaps she had GE reflux-was placed on PPI and I think she may continue with this  Perhaps had an element of anxiety with palpitations  Hyperlipidemia treated with simvastatin 20 mg daily  Hypertension treated with losartan 100 mg daily  Plan: She will continue current medications and follow-up in June.  This includes PPI, simvastatin, metoprolol and losartan.  Call if symptoms recur.

## 2022-11-21 LAB — BASIC METABOLIC PANEL
BUN: 20 mg/dL (ref 7–25)
CO2: 25 mmol/L (ref 20–32)
Calcium: 10 mg/dL (ref 8.6–10.4)
Chloride: 105 mmol/L (ref 98–110)
Creat: 0.65 mg/dL (ref 0.60–1.00)
Glucose, Bld: 114 mg/dL — ABNORMAL HIGH (ref 65–99)
Potassium: 4.3 mmol/L (ref 3.5–5.3)
Sodium: 142 mmol/L (ref 135–146)

## 2022-11-21 LAB — CBC WITH DIFFERENTIAL/PLATELET
Absolute Monocytes: 537 cells/uL (ref 200–950)
Basophils Absolute: 32 cells/uL (ref 0–200)
Basophils Relative: 0.4 %
Eosinophils Absolute: 79 cells/uL (ref 15–500)
Eosinophils Relative: 1 %
HCT: 37.9 % (ref 35.0–45.0)
Hemoglobin: 12.7 g/dL (ref 11.7–15.5)
Lymphs Abs: 2180 cells/uL (ref 850–3900)
MCH: 29.3 pg (ref 27.0–33.0)
MCHC: 33.5 g/dL (ref 32.0–36.0)
MCV: 87.3 fL (ref 80.0–100.0)
MPV: 11 fL (ref 7.5–12.5)
Monocytes Relative: 6.8 %
Neutro Abs: 5072 cells/uL (ref 1500–7800)
Neutrophils Relative %: 64.2 %
Platelets: 264 10*3/uL (ref 140–400)
RBC: 4.34 10*6/uL (ref 3.80–5.10)
RDW: 12.4 % (ref 11.0–15.0)
Total Lymphocyte: 27.6 %
WBC: 7.9 10*3/uL (ref 3.8–10.8)

## 2022-12-04 NOTE — Patient Instructions (Signed)
Pneumococcal 20 vaccine given.  Continue current medications and follow-up in June 2024.  Received high-dose flu vaccine November 2023.

## 2022-12-06 NOTE — Patient Instructions (Addendum)
We are glad you are feeling better.  We agree with treatment plan per Dr. Einar Gip.  Continue with PPI, simvastatin, metoprolol and losartan.  Please call if symptoms recur or you have any questions whatsoever.  We will see you in June for follow-up.

## 2023-01-21 ENCOUNTER — Encounter: Payer: Self-pay | Admitting: Gastroenterology

## 2023-01-30 ENCOUNTER — Other Ambulatory Visit: Payer: Self-pay | Admitting: Internal Medicine

## 2023-02-12 ENCOUNTER — Other Ambulatory Visit: Payer: Self-pay | Admitting: Internal Medicine

## 2023-04-15 NOTE — Progress Notes (Addendum)
Annual Wellness Visit    Patient Care Team: Margaree Mackintosh, MD as PCP - General (Internal Medicine)  Visit Date: 04/22/23   Chief Complaint  Patient presents with   Medicare Wellness    Subjective:   Patient: Linda Sosa, Female    DOB: [birth year], 77 y.o.   MRN: [id number]  CAMYIA Sosa is a 77 y.o. Female who presents today for her Annual Wellness Visit.  History of anemia treated with ferrous sulfate 325 mg daily in the morning.  History of hyperlipidemia treated with simvastatin 20 mg daily with supper. TRIG elevated at 169.   History of GERD treated with omeprazole 20 mg daily before breakfast. Tried stopping this due to diarrhea but diarrhea did not resolve. She has had diarrhea for the past two months after eating. Has not been traveling.   History of hypertension treated with metoprolol succinate 25 mg daily with or immediately following meal, losartan 100 mg daily. Blood pressure elevated today at 158/74.  History of osteoporosis. Taking calcium carbonate 600 mg daily in the morning. Last bone density study was in 2020.  Has been treated by Dr. Lucianne Muss with Prolia in the past but has stopped these injections.  She takes vitamin D supplements.  Before Prolia, she took Fosamax for a long time for age-related osteoporosis.  Has not had recent bone density study.  Last one was in 2020 and T score was -2.8. She declines bone density testing at this time.   History of Vitamin D deficiency. Taking Vitamin D supplement.  Past medical history: Cholecystectomy 1997.  Was found to be vitamin D deficient in 2010.  Zocor started in 2011.  She has a history of labile hypertension.  She developed microcytic anemia in 2014.  Anemia improved with iron supplementation.  She had colonoscopy in February 2014 which was her first colonoscopy and was done by Dr. Jarold Motto.  It was normal except for diverticulosis.  Has not had any further recurrence of anemia and has not wanted to have  another colonoscopy.  Would be a candidate for Cologuard.  Status post left heart catheterization and coronary angiography 11/18/22.  Glucose slightly elevated at 100. Creatinine low at 0.55. Liver functions normal. Electrolytes normal. Blood proteins normal. CBC normal. TSH at 1.37. Vitamin D normal at 60.  Mammogram last completed 09/09/22. No mammographic evidence of malignancy. Recommended repeat in 2024.  Colonoscopy done 2014 with repeat study recommended in 10 years.   Social history: She is a widow.  Husband passed away from complications of non-Hodgkin's lymphoma.  She stays at home and enjoys gardening with flowers.  She completed 2 years of college.  She does not smoke or consume alcohol.  One daughter.  Family history: Father died at age 8 with heart problems with history of diabetes.  Mother died at age 93 of ovarian cancer.  1 brother died with heart problems.  2 sisters in fairly good health but one is a diabetic.  Daughter in good health.  Past Medical History:  Diagnosis Date   Anemia    Hyperlipidemia    Hypertension    Osteoporosis    Vitamin D deficiency      Family History  Problem Relation Age of Onset   Cancer Mother    Diabetes Father    Heart disease Father    Coronary artery disease Father 41   Coronary artery disease Brother 5   Colon cancer Neg Hx    Stomach cancer Neg Hx  Breast cancer Neg Hx    Osteoporosis Neg Hx    Thyroid disease Neg Hx      Social History   Social History Narrative   Not on file     Review of Systems  Constitutional:  Negative for chills, fever, malaise/fatigue and weight loss.  HENT:  Negative for hearing loss, sinus pain and sore throat.   Respiratory:  Negative for cough, hemoptysis and shortness of breath.   Cardiovascular:  Negative for chest pain, palpitations, leg swelling and PND.  Gastrointestinal:  Negative for abdominal pain, constipation, diarrhea, heartburn, nausea and vomiting.  Genitourinary:   Negative for dysuria, frequency and urgency.  Musculoskeletal:  Negative for back pain, myalgias and neck pain.  Skin:  Negative for itching and rash.  Neurological:  Negative for dizziness, tingling, seizures and headaches.  Endo/Heme/Allergies:  Negative for polydipsia.  Psychiatric/Behavioral:  Negative for depression. The patient is not nervous/anxious.       Objective:   Vitals: BP (!) 158/74   Pulse 86   Temp 97.6 F (36.4 C) (Temporal)   Resp 16   Ht 5' (1.524 m)   Wt 159 lb 4 oz (72.2 kg)   SpO2 99%   BMI 31.10 kg/m   Physical Exam Vitals and nursing note reviewed.  Constitutional:      General: She is not in acute distress.    Appearance: Normal appearance. She is not ill-appearing or toxic-appearing.  HENT:     Head: Normocephalic and atraumatic.     Right Ear: Hearing, tympanic membrane, ear canal and external ear normal.     Left Ear: Hearing, tympanic membrane, ear canal and external ear normal.     Mouth/Throat:     Pharynx: Oropharynx is clear.  Eyes:     Extraocular Movements: Extraocular movements intact.     Pupils: Pupils are equal, round, and reactive to light.  Neck:     Thyroid: No thyroid mass, thyromegaly or thyroid tenderness.     Vascular: No carotid bruit.  Cardiovascular:     Rate and Rhythm: Normal rate and regular rhythm. No extrasystoles are present.    Pulses:          Dorsalis pedis pulses are 1+ on the right side and 1+ on the left side.     Heart sounds: Normal heart sounds. No murmur heard.    No friction rub. No gallop.  Pulmonary:     Effort: Pulmonary effort is normal.     Breath sounds: Normal breath sounds. No decreased breath sounds, wheezing, rhonchi or rales.  Chest:     Chest wall: No mass.  Abdominal:     Palpations: Abdomen is soft. There is no hepatomegaly, splenomegaly or mass.     Tenderness: There is no abdominal tenderness.     Hernia: No hernia is present.  Musculoskeletal:     Cervical back: Normal range of  motion.     Right lower leg: No edema.     Left lower leg: No edema.  Lymphadenopathy:     Cervical: No cervical adenopathy.     Upper Body:     Right upper body: No supraclavicular adenopathy.     Left upper body: No supraclavicular adenopathy.  Skin:    General: Skin is warm and dry.  Neurological:     General: No focal deficit present.     Mental Status: She is alert and oriented to person, place, and time. Mental status is at baseline.     Sensory:  Sensation is intact.     Motor: Motor function is intact. No weakness.     Deep Tendon Reflexes: Reflexes are normal and symmetric.  Psychiatric:        Attention and Perception: Attention normal.        Mood and Affect: Mood normal.        Speech: Speech normal.        Behavior: Behavior normal.        Thought Content: Thought content normal.        Cognition and Memory: Cognition normal.        Judgment: Judgment normal.      Most recent functional status assessment:    04/22/2023   10:11 AM  In your present state of health, do you have any difficulty performing the following activities:  Hearing? 0  Vision? 0  Difficulty concentrating or making decisions? 0  Walking or climbing stairs? 0  Dressing or bathing? 0  Doing errands, shopping? 0  Preparing Food and eating ? N  Using the Toilet? N  In the past six months, have you accidently leaked urine? N  Do you have problems with loss of bowel control? Y  Managing your Medications? N  Managing your Finances? N  Housekeeping or managing your Housekeeping? N   Most recent fall risk assessment:    04/22/2023   10:11 AM  Fall Risk   Falls in the past year? 1  Number falls in past yr: 0  Injury with Fall? 1  Risk for fall due to : History of fall(s)    Most recent depression screenings:    04/22/2023   10:11 AM 11/20/2022   12:09 PM  PHQ 2/9 Scores  PHQ - 2 Score 0 0   Most recent cognitive screening:    04/22/2023   10:12 AM  6CIT Screen  What Year? 0 points   What month? 0 points  What time? 0 points  Count back from 20 0 points  Months in reverse 0 points  Repeat phrase 2 points  Total Score 2 points     Results:   Studies obtained and personally reviewed by me:  Mammogram last completed 09/09/22. No mammographic evidence of malignancy. Recommended repeat in 2024.  Colonoscopy done 2014 with repeat study recommended in 10 years.   Labs:       Component Value Date/Time   NA 141 04/19/2023 0906   K 4.2 04/19/2023 0906   CL 103 04/19/2023 0906   CO2 27 04/19/2023 0906   GLUCOSE 100 (H) 04/19/2023 0906   BUN 20 04/19/2023 0906   CREATININE 0.55 (L) 04/19/2023 0906   CALCIUM 9.6 04/19/2023 0906   PROT 6.4 04/19/2023 0906   ALBUMIN 3.6 11/18/2022 0438   AST 13 04/19/2023 0906   ALT 8 04/19/2023 0906   ALKPHOS 64 11/17/2022 1330   BILITOT 0.3 04/19/2023 0906   GFRNONAA >60 11/18/2022 0438   GFRNONAA 87 03/15/2020 0908   GFRAA 101 03/15/2020 0908     Lab Results  Component Value Date   WBC 5.8 04/19/2023   HGB 13.1 04/19/2023   HCT 39.4 04/19/2023   MCV 86.4 04/19/2023   PLT 245 04/19/2023    Lab Results  Component Value Date   CHOL 182 04/19/2023   HDL 58 04/19/2023   LDLCALC 97 04/19/2023   TRIG 169 (H) 04/19/2023   CHOLHDL 3.1 04/19/2023    Lab Results  Component Value Date   HGBA1C 5.8 (H) 11/18/2022  Lab Results  Component Value Date   TSH 1.37 04/19/2023    Assessment & Plan:   Diarrhea: take Bentyl 20 mg three times daily before meals.   Hx of Anemia: treated with ferrous sulfate 325 mg daily in the morning.  Hyperlipidemia: treated with simvastatin 20 mg daily with supper. TRIG elevated at 169. Watch diet a bit.  GERD: treated with omeprazole 20 mg daily before breakfast. Refilled.  Hypertension: treated with metoprolol succinate 25 mg daily with or immediately following meal, losartan 100 mg daily.  Vitamin D deficiency: taking Vitamin D supplement.  Osteoporosis- previously treated  with Prolia and Fosamax but not on these now  Hx of humerus fracture August 2023  Pelvic exam deferred.  Mammogram: last completed 09/09/22. No mammographic evidence of malignancy. Recommended repeat in 2024.  Colonoscopy done 2014  repeat study recommended in 10 years by GI.  Patient also could do Cologard as a option before age 74.  Vaccine counseling: will go to pharmacy for tetanus vaccine. UTD on pneumococcal 20 vaccine. Discussed updating flu, Covid-19 vaccines in the fall.  Return in 6 months for follow-up or as needed.Continue current meds. Vaccines recommended as  above     Annual wellness visit done today including the all of the following: Reviewed patient's Family Medical History Reviewed and updated list of patient's medical providers Assessment of cognitive impairment was done Assessed patient's functional ability Established a written schedule for health screening services Health Risk Assessent Completed and Reviewed  Discussed health benefits of physical activity, and encouraged her to engage in regular exercise appropriate for her age and condition.        I,Alexander Ruley,acting as a Neurosurgeon for Margaree Mackintosh, MD.,have documented all relevant documentation on the behalf of Margaree Mackintosh, MD,as directed by  Margaree Mackintosh, MD while in the presence of Margaree Mackintosh, MD.   I, Margaree Mackintosh, MD, have reviewed all documentation for this visit. The documentation on 04/24/23 for the exam, diagnosis, procedures, and orders are all accurate and complete.

## 2023-04-19 ENCOUNTER — Other Ambulatory Visit: Payer: Medicare Other

## 2023-04-19 DIAGNOSIS — I10 Essential (primary) hypertension: Secondary | ICD-10-CM

## 2023-04-19 DIAGNOSIS — E781 Pure hyperglyceridemia: Secondary | ICD-10-CM

## 2023-04-19 DIAGNOSIS — Z8639 Personal history of other endocrine, nutritional and metabolic disease: Secondary | ICD-10-CM

## 2023-04-19 DIAGNOSIS — Z0000 Encounter for general adult medical examination without abnormal findings: Secondary | ICD-10-CM

## 2023-04-19 DIAGNOSIS — Z1329 Encounter for screening for other suspected endocrine disorder: Secondary | ICD-10-CM

## 2023-04-20 LAB — CBC WITH DIFFERENTIAL/PLATELET
Absolute Monocytes: 464 cells/uL (ref 200–950)
Basophils Absolute: 17 cells/uL (ref 0–200)
Basophils Relative: 0.3 %
Eosinophils Absolute: 52 cells/uL (ref 15–500)
Eosinophils Relative: 0.9 %
HCT: 39.4 % (ref 35.0–45.0)
Hemoglobin: 13.1 g/dL (ref 11.7–15.5)
Lymphs Abs: 1757 cells/uL (ref 850–3900)
MCH: 28.7 pg (ref 27.0–33.0)
MCHC: 33.2 g/dL (ref 32.0–36.0)
MCV: 86.4 fL (ref 80.0–100.0)
MPV: 10.6 fL (ref 7.5–12.5)
Monocytes Relative: 8 %
Neutro Abs: 3509 cells/uL (ref 1500–7800)
Neutrophils Relative %: 60.5 %
Platelets: 245 10*3/uL (ref 140–400)
RBC: 4.56 10*6/uL (ref 3.80–5.10)
RDW: 12.4 % (ref 11.0–15.0)
Total Lymphocyte: 30.3 %
WBC: 5.8 10*3/uL (ref 3.8–10.8)

## 2023-04-20 LAB — COMPLETE METABOLIC PANEL WITH GFR
AG Ratio: 1.9 (calc) (ref 1.0–2.5)
ALT: 8 U/L (ref 6–29)
AST: 13 U/L (ref 10–35)
Albumin: 4.2 g/dL (ref 3.6–5.1)
Alkaline phosphatase (APISO): 79 U/L (ref 37–153)
BUN/Creatinine Ratio: 36 (calc) — ABNORMAL HIGH (ref 6–22)
BUN: 20 mg/dL (ref 7–25)
CO2: 27 mmol/L (ref 20–32)
Calcium: 9.6 mg/dL (ref 8.6–10.4)
Chloride: 103 mmol/L (ref 98–110)
Creat: 0.55 mg/dL — ABNORMAL LOW (ref 0.60–1.00)
Globulin: 2.2 g/dL (calc) (ref 1.9–3.7)
Glucose, Bld: 100 mg/dL — ABNORMAL HIGH (ref 65–99)
Potassium: 4.2 mmol/L (ref 3.5–5.3)
Sodium: 141 mmol/L (ref 135–146)
Total Bilirubin: 0.3 mg/dL (ref 0.2–1.2)
Total Protein: 6.4 g/dL (ref 6.1–8.1)
eGFR: 94 mL/min/{1.73_m2} (ref >=60)

## 2023-04-20 LAB — LIPID PANEL
Cholesterol: 182 mg/dL (ref <200)
HDL: 58 mg/dL (ref >=50)
LDL Cholesterol (Calc): 97 mg/dL (calc)
Non-HDL Cholesterol (Calc): 124 mg/dL (calc) (ref <130)
Total CHOL/HDL Ratio: 3.1 (calc) (ref <5.0)
Triglycerides: 169 mg/dL — ABNORMAL HIGH (ref <150)

## 2023-04-20 LAB — VITAMIN D 25 HYDROXY (VIT D DEFICIENCY, FRACTURES): Vit D, 25-Hydroxy: 60 ng/mL (ref 30–100)

## 2023-04-20 LAB — TSH: TSH: 1.37 mIU/L (ref 0.40–4.50)

## 2023-04-22 ENCOUNTER — Encounter: Payer: Self-pay | Admitting: Internal Medicine

## 2023-04-22 ENCOUNTER — Ambulatory Visit (INDEPENDENT_AMBULATORY_CARE_PROVIDER_SITE_OTHER): Payer: Medicare Other | Admitting: Internal Medicine

## 2023-04-22 VITALS — BP 158/74 | HR 86 | Temp 97.6°F | Resp 16 | Ht 60.0 in | Wt 159.2 lb

## 2023-04-22 DIAGNOSIS — Z0000 Encounter for general adult medical examination without abnormal findings: Secondary | ICD-10-CM

## 2023-04-22 DIAGNOSIS — K580 Irritable bowel syndrome with diarrhea: Secondary | ICD-10-CM

## 2023-04-22 DIAGNOSIS — M818 Other osteoporosis without current pathological fracture: Secondary | ICD-10-CM | POA: Diagnosis not present

## 2023-04-22 DIAGNOSIS — I10 Essential (primary) hypertension: Secondary | ICD-10-CM | POA: Diagnosis not present

## 2023-04-22 DIAGNOSIS — Z8781 Personal history of (healed) traumatic fracture: Secondary | ICD-10-CM

## 2023-04-22 DIAGNOSIS — K219 Gastro-esophageal reflux disease without esophagitis: Secondary | ICD-10-CM

## 2023-04-22 DIAGNOSIS — E781 Pure hyperglyceridemia: Secondary | ICD-10-CM

## 2023-04-22 LAB — POCT URINALYSIS DIPSTICK
Bilirubin, UA: NEGATIVE
Blood, UA: NEGATIVE
Glucose, UA: NEGATIVE
Ketones, UA: NEGATIVE
Leukocytes, UA: NEGATIVE
Nitrite, UA: NEGATIVE
Protein, UA: NEGATIVE
Spec Grav, UA: 1.01 (ref 1.010–1.025)
Urobilinogen, UA: 0.2 E.U./dL
pH, UA: 5 (ref 5.0–8.0)

## 2023-04-22 MED ORDER — OMEPRAZOLE 20 MG PO CPDR: 20.0000 mg | DELAYED_RELEASE_CAPSULE | Freq: Every day | ORAL | 3 refills | Status: DC

## 2023-04-22 MED ORDER — DICYCLOMINE HCL 20 MG PO TABS: 20.0000 mg | ORAL_TABLET | Freq: Three times a day (TID) | ORAL | 5 refills | Status: DC

## 2023-04-22 NOTE — Patient Instructions (Signed)
  Ms. Clute , Thank you for taking time to come for your Medicare Wellness Visit. I appreciate your ongoing commitment to your health goals. Please review the following plan we discussed and let me know if I can assist you in the future.   These are the goals we discussed:  Goals   None     This is a list of the screening recommended for you and due dates:  Health Maintenance  Topic Date Due   DTaP/Tdap/Td vaccine (2 - Td or Tdap) 10/09/2020   Zoster (Shingles) Vaccine (1 of 2) 07/23/2023*   Flu Shot  06/10/2023   Mammogram  09/08/2023   Medicare Annual Wellness Visit  04/18/2024   Pneumonia Vaccine  Completed   DEXA scan (bone density measurement)  Completed   HPV Vaccine  Aged Out   Colon Cancer Screening  Discontinued   COVID-19 Vaccine  Discontinued   Hepatitis C Screening  Discontinued  *Topic was postponed. The date shown is not the original due date.

## 2023-04-24 ENCOUNTER — Encounter: Payer: Self-pay | Admitting: Internal Medicine

## 2023-06-09 ENCOUNTER — Other Ambulatory Visit: Payer: Self-pay | Admitting: Internal Medicine

## 2023-08-07 ENCOUNTER — Other Ambulatory Visit: Payer: Self-pay | Admitting: Internal Medicine

## 2023-08-09 ENCOUNTER — Other Ambulatory Visit: Payer: Self-pay | Admitting: Internal Medicine

## 2023-08-09 DIAGNOSIS — Z1231 Encounter for screening mammogram for malignant neoplasm of breast: Secondary | ICD-10-CM

## 2023-08-21 IMAGING — MG MM DIGITAL SCREENING BILAT W/ TOMO AND CAD
8 series · 8 of 24 positions shown · non-contrast
Comparison: Previous exam(s).

CLINICAL DATA: Screening.

EXAM:
DIGITAL SCREENING BILATERAL MAMMOGRAM WITH TOMOSYNTHESIS AND CAD
TECHNIQUE: Bilateral screening digital craniocaudal and mediolateral oblique
mammograms were obtained. Bilateral screening digital breast
tomosynthesis was performed. The images were evaluated with
computer-aided detection.

[L CC synth-2D]
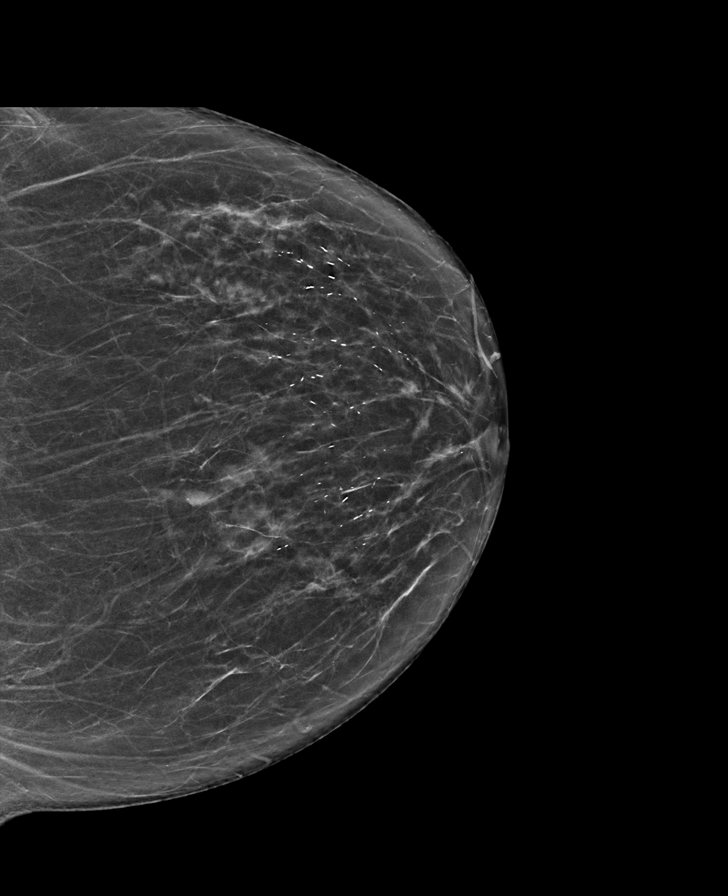

[R CC synth-2D]
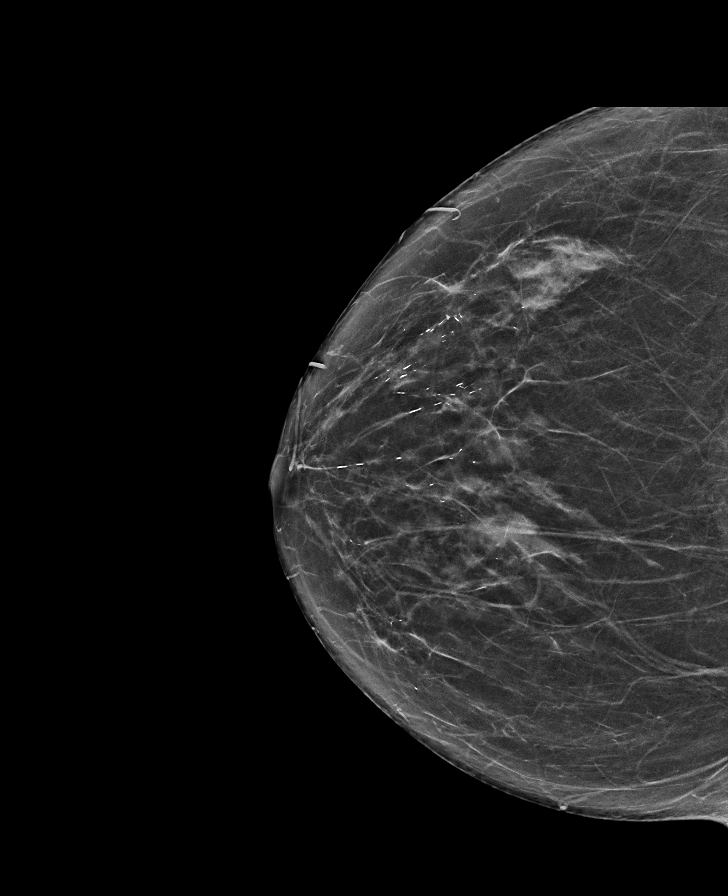

[L MLO synth-2D]
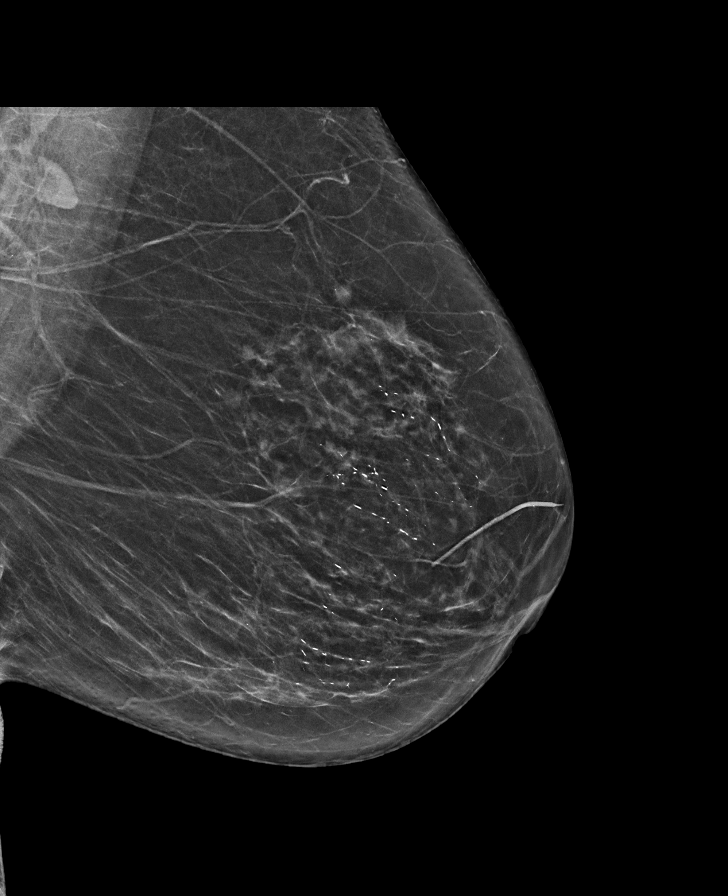

[R MLO synth-2D]
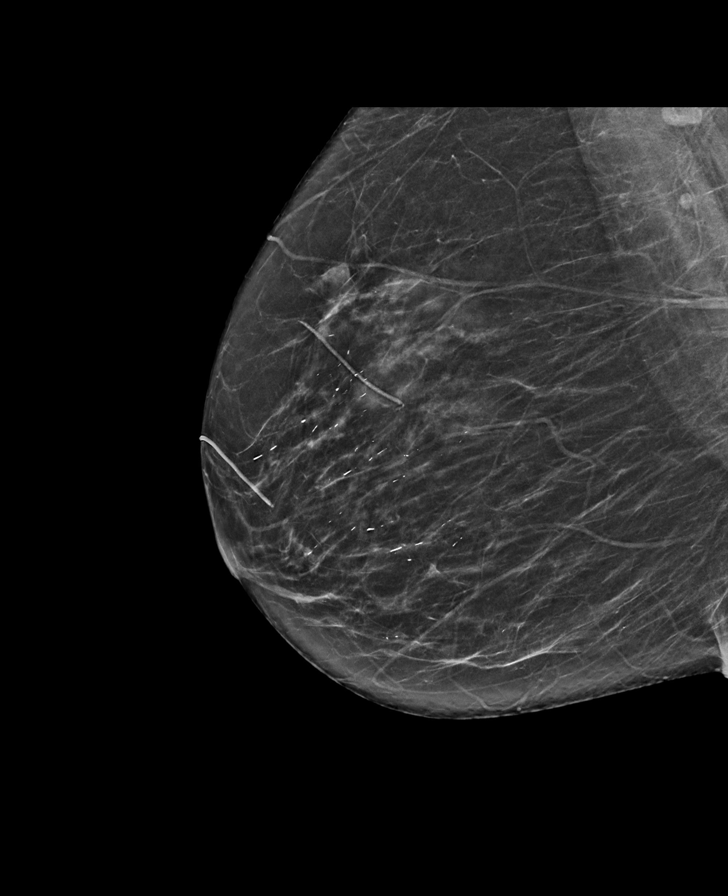

[L CC tomo · tomo slice 33/66.0]
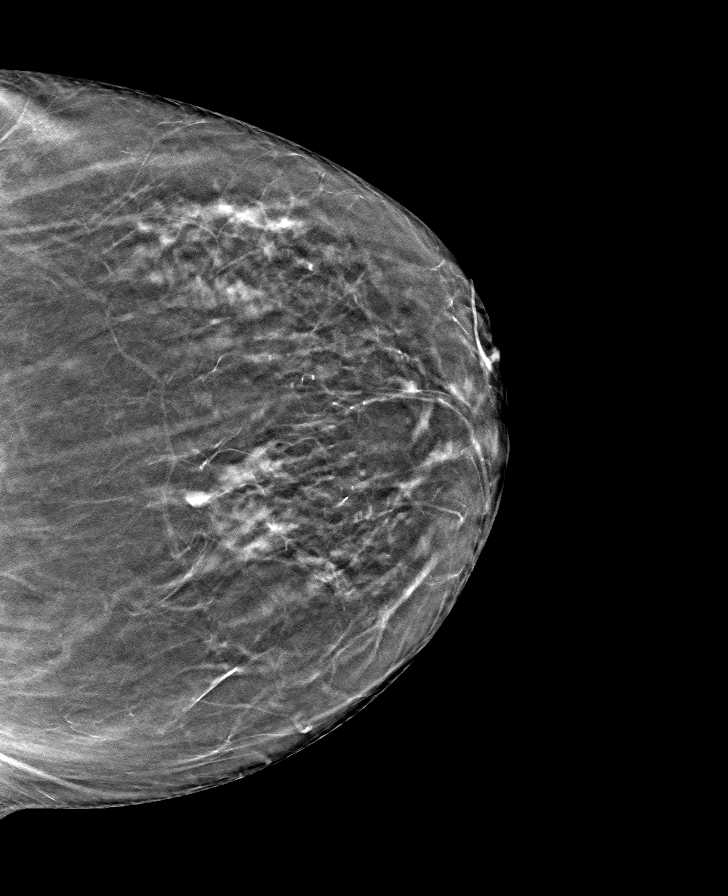

[R CC tomo · tomo slice 35/68.0]
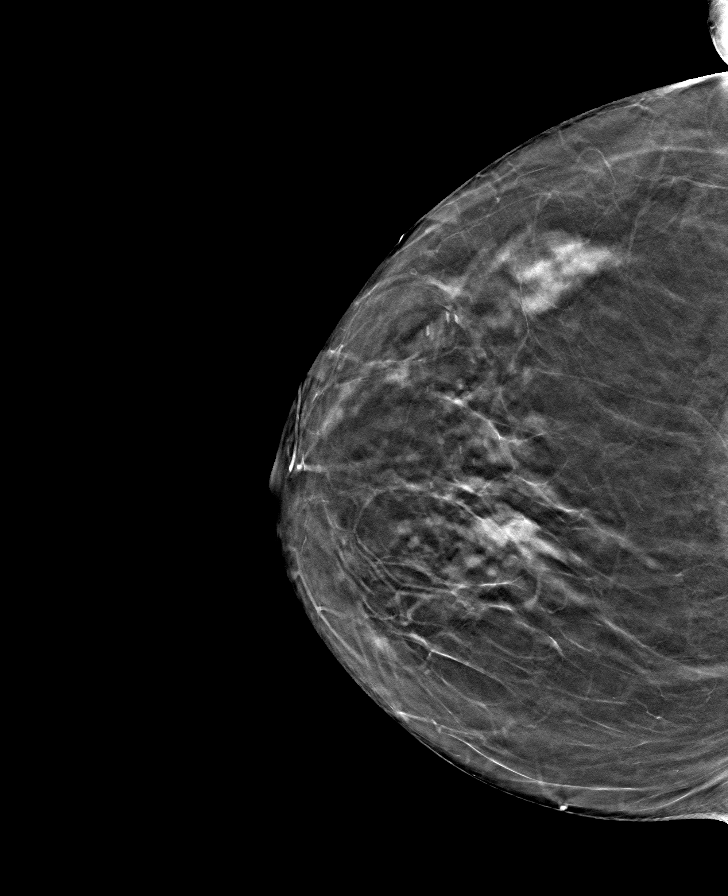

[R MLO tomo · tomo slice 33/65.0]
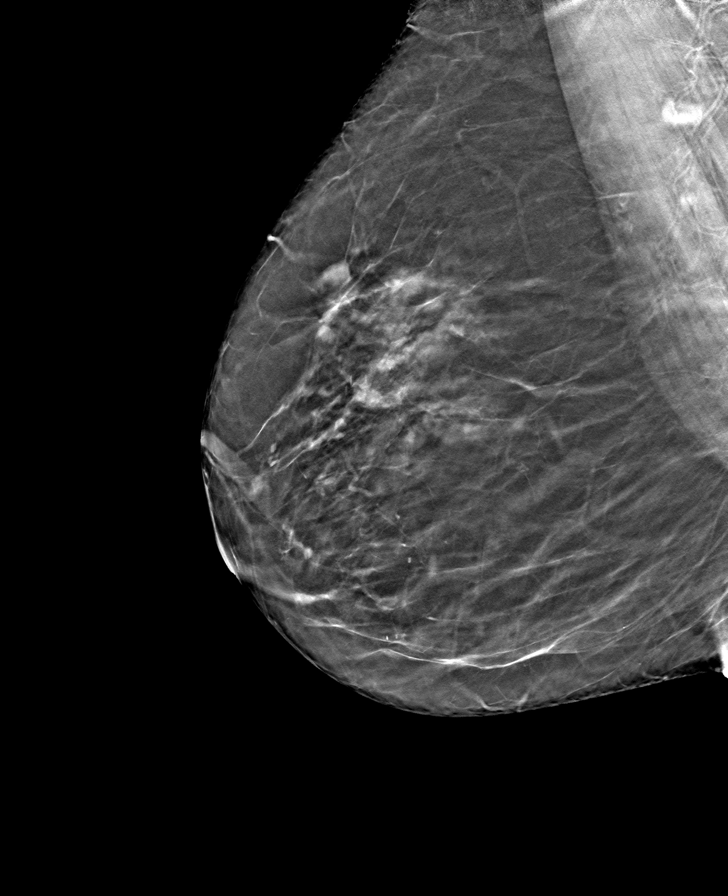

[L MLO tomo · tomo slice 35/70.0]
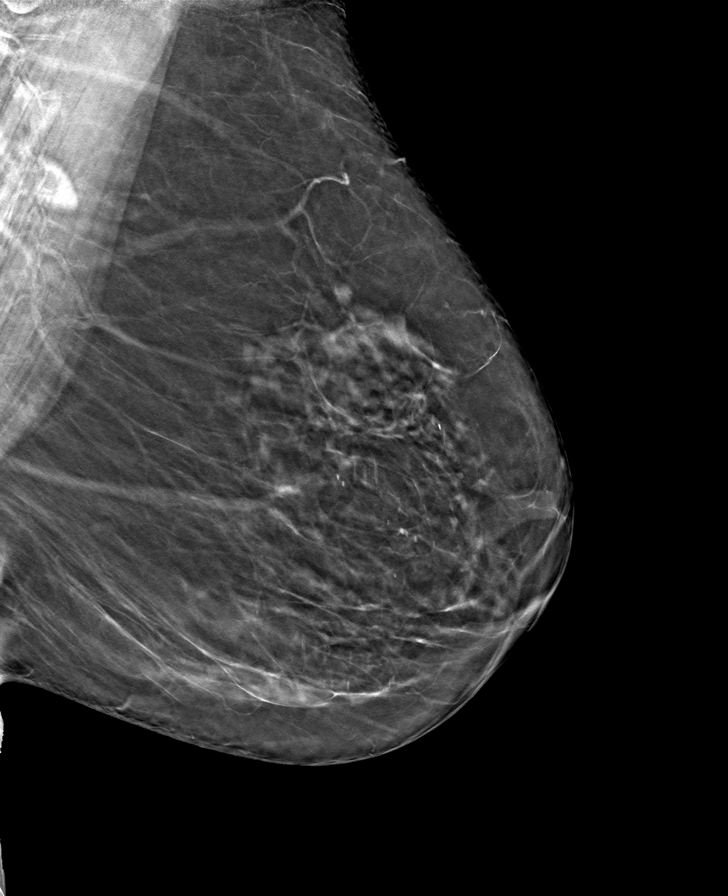

[8 of 24 positions shown; findings below may reference images not displayed]

ACR Breast Density Category b: There are scattered areas of
fibroglandular density.
FINDINGS: There are no findings suspicious for malignancy.
IMPRESSION: No mammographic evidence of malignancy. A result letter of this
screening mammogram will be mailed directly to the patient.

RECOMMENDATION:
Screening mammogram in one year. (Code:51-O-LD2)

BI-RADS CATEGORY  1: Negative.

## 2023-09-09 ENCOUNTER — Ambulatory Visit
Admission: RE | Admit: 2023-09-09 | Discharge: 2023-09-09 | Disposition: A | Discharge status: Home / Self Care | Payer: Medicare Other | Source: Ambulatory Visit | Attending: Internal Medicine | Admitting: Internal Medicine

## 2023-09-09 DIAGNOSIS — Z1231 Encounter for screening mammogram for malignant neoplasm of breast: Secondary | ICD-10-CM

## 2023-09-14 ENCOUNTER — Other Ambulatory Visit: Payer: Self-pay | Admitting: Internal Medicine

## 2023-09-14 DIAGNOSIS — R928 Other abnormal and inconclusive findings on diagnostic imaging of breast: Secondary | ICD-10-CM

## 2023-09-23 ENCOUNTER — Ambulatory Visit: Payer: Medicare Other

## 2023-09-23 ENCOUNTER — Ambulatory Visit
Admission: RE | Admit: 2023-09-23 | Discharge: 2023-09-23 | Disposition: A | Discharge status: Home / Self Care | Payer: Medicare Other | Source: Ambulatory Visit | Attending: Internal Medicine | Admitting: Internal Medicine

## 2023-09-23 DIAGNOSIS — R928 Other abnormal and inconclusive findings on diagnostic imaging of breast: Secondary | ICD-10-CM

## 2023-10-18 NOTE — Progress Notes (Shared)
Patient Care Team: Margaree Mackintosh, MD as PCP - General (Internal Medicine)  Visit Date: 10/18/23  Subjective:    Patient ID: Linda Sosa , Female   DOB: 01/23/1946, 77 y.o.    MRN: [id number]   77 y.o. Female presents today for a 72-month checkup.  History of IBS-D treated with Bentyl 20 mg three times daily before meals.  History of anemia treated with ferrous sulfate 325 mg daily in the morning.   History of hyperlipidemia treated with simvastatin 20 mg daily with supper. TRIG elevated at 169.    History of GERD treated with omeprazole 20 mg daily before breakfast. Tried stopping this due to diarrhea but diarrhea did not resolve. She has had diarrhea for the past two months after eating. Has not been traveling.    History of hypertension treated with metoprolol succinate 25 mg daily with or immediately following meal, losartan 100 mg daily. Blood pressure elevated today at   History of osteoporosis. Taking calcium carbonate 600 mg daily in the morning. Last bone density study was in 2020.  Has been treated by Dr. Lucianne Muss with Prolia in the past but has stopped these injections.  She takes vitamin D supplements.  Before Prolia, she took Fosamax for a long time for age-related osteoporosis.  Has not had recent bone density study.  Last one was in 2020 and T score was -2.8. She declines bone density testing at this time.   Status post left heart catheterization and coronary angiography 11/18/22.   Mammogram last completed 09/09/22. No mammographic evidence of malignancy. Recommended repeat in 2024.   Colonoscopy done 2014 with repeat study recommended in 10 years.  Past Medical History:  Diagnosis Date   Anemia    Hyperlipidemia    Hypertension    Osteoporosis    Vitamin D deficiency      Family History  Problem Relation Age of Onset   Cancer Mother    Diabetes Father    Heart disease Father    Coronary artery disease Father 53   Coronary artery disease Brother 51    Colon cancer Neg Hx    Stomach cancer Neg Hx    Breast cancer Neg Hx    Osteoporosis Neg Hx    Thyroid disease Neg Hx     Social History   Social History Narrative   Not on file      ROS      Objective:   Vitals: There were no vitals taken for this visit.   Physical Exam    Results:   Studies obtained and personally reviewed by me:  Imaging, colonoscopy, mammogram, bone density scan, echocardiogram, heart cath, stress test, CT calcium score, etc. ***   Labs:       Component Value Date/Time   NA 141 04/19/2023 0906   K 4.2 04/19/2023 0906   CL 103 04/19/2023 0906   CO2 27 04/19/2023 0906   GLUCOSE 100 (H) 04/19/2023 0906   BUN 20 04/19/2023 0906   CREATININE 0.55 (L) 04/19/2023 0906   CALCIUM 9.6 04/19/2023 0906   PROT 6.4 04/19/2023 0906   ALBUMIN 3.6 11/18/2022 0438   AST 13 04/19/2023 0906   ALT 8 04/19/2023 0906   ALKPHOS 64 11/17/2022 1330   BILITOT 0.3 04/19/2023 0906   GFRNONAA >60 11/18/2022 0438   GFRNONAA 87 03/15/2020 0908   GFRAA 101 03/15/2020 0908     Lab Results  Component Value Date   WBC 5.8 04/19/2023   HGB 13.1  04/19/2023   HCT 39.4 04/19/2023   MCV 86.4 04/19/2023   PLT 245 04/19/2023    Lab Results  Component Value Date   CHOL 182 04/19/2023   HDL 58 04/19/2023   LDLCALC 97 04/19/2023   TRIG 169 (H) 04/19/2023   CHOLHDL 3.1 04/19/2023    Lab Results  Component Value Date   HGBA1C 5.8 (H) 11/18/2022     Lab Results  Component Value Date   TSH 1.37 04/19/2023     No results found for: "PSA1", "PSA" *** delete for female pts  ***    Assessment & Plan:   ***    I,Alexander Ruley,acting as a scribe for Margaree Mackintosh, MD.,have documented all relevant documentation on the behalf of Margaree Mackintosh, MD,as directed by  Margaree Mackintosh, MD while in the presence of Margaree Mackintosh, MD.   ***

## 2023-10-21 ENCOUNTER — Other Ambulatory Visit: Payer: Medicare Other

## 2023-10-21 DIAGNOSIS — I10 Essential (primary) hypertension: Secondary | ICD-10-CM

## 2023-10-21 DIAGNOSIS — E781 Pure hyperglyceridemia: Secondary | ICD-10-CM

## 2023-10-21 DIAGNOSIS — M818 Other osteoporosis without current pathological fracture: Secondary | ICD-10-CM

## 2023-10-21 DIAGNOSIS — R7302 Impaired glucose tolerance (oral): Secondary | ICD-10-CM

## 2023-10-22 LAB — HEPATIC FUNCTION PANEL
AG Ratio: 2 (calc) (ref 1.0–2.5)
ALT: 13 U/L (ref 6–29)
AST: 15 U/L (ref 10–35)
Albumin: 4.4 g/dL (ref 3.6–5.1)
Alkaline phosphatase (APISO): 74 U/L (ref 37–153)
Bilirubin, Direct: 0.1 mg/dL (ref 0.0–0.2)
Globulin: 2.2 g/dL (ref 1.9–3.7)
Indirect Bilirubin: 0.4 mg/dL (ref 0.2–1.2)
Total Bilirubin: 0.5 mg/dL (ref 0.2–1.2)
Total Protein: 6.6 g/dL (ref 6.1–8.1)

## 2023-10-22 LAB — HEMOGLOBIN A1C
Hgb A1c MFr Bld: 6.1 %{Hb} — ABNORMAL HIGH (ref <5.7)
Mean Plasma Glucose: 128 mg/dL
eAG (mmol/L): 7.1 mmol/L

## 2023-10-22 LAB — LIPID PANEL
Cholesterol: 206 mg/dL — ABNORMAL HIGH (ref <200)
HDL: 66 mg/dL (ref >=50)
LDL Cholesterol (Calc): 114 mg/dL — ABNORMAL HIGH
Non-HDL Cholesterol (Calc): 140 mg/dL — ABNORMAL HIGH (ref <130)
Total CHOL/HDL Ratio: 3.1 (calc) (ref <5.0)
Triglycerides: 144 mg/dL (ref <150)

## 2023-10-25 ENCOUNTER — Ambulatory Visit: Payer: Medicare Other | Admitting: Internal Medicine

## 2023-10-26 NOTE — Progress Notes (Signed)
Patient Care Team: Margaree Mackintosh, MD as PCP - General (Internal Medicine)  Visit Date: 10/28/23  Subjective:    Patient ID: Linda Sosa , Female   DOB: 1946-04-23, 77 y.o.    MRN: [id number]   77 y.o. Female presents today for 6 month follow up. Patient has a past medical history of hypertension, hyperlipidemia, GERD, anemia, vitamin-D deficiency.   History of Hypertension treated with metoprolol succinate 25 mg daily with or immediately following meal, losartan 100 mg daily. Blood pressure today 120/80, improved compared to 158/74. Denied LE edema.   Left heart catheterization and coronary angiography 11/18/22.  History of hyperlipidemia treated with simvastatin 20 mg daily with supper. 10/21/23 lipid panel resulted cholesterol 206, LDL 114, and non-HDL cholesterol 140, while TRIG improved to 144 from 169 on 04/19/23.    History of GERD treated with omeprazole 20 mg daily before breakfast. Tried stopping this due to diarrhea but diarrhea did not resolve.    History of osteoporosis. Taking calcium carbonate 600 mg daily in the morning. Last bone density study was in 2020.  Has been treated by Dr. Lucianne Muss with Prolia in the past but has stopped these injections.  She takes vitamin D supplements.  Before Prolia, she took Fosamax for a long time for age-related osteoporosis.  Has not had recent bone density study.  Last one was in 2020 and T score was -2.8. She declines bone density testing at this time.   History of anemia treated with ferrous sulfate 325 mg daily in the morning.   History of Vitamin D deficiency. Taking Vitamin D supplement.  10/21/23 HbgA1c 6.1. Hepatic function panel WNL.    Mammogram most recently completed 09/23/23 with findings of resolution of the asymmetry questioned on screening mammogram (09/09/23) in the left breast. Otherwise no mammographic evidence of malignancy; recommended repeat 1 year (2025).   Vaccine Counseling: UTD on flu and pneumonia;  discussed covid vaccination and she decided will get hers at her pharmacy;  Past Medical History:  Diagnosis Date   Anemia    Hyperlipidemia    Hypertension    Osteoporosis    Vitamin D deficiency     Family History  Problem Relation Age of Onset   Cancer Mother    Diabetes Father    Heart disease Father    Coronary artery disease Father 67   Coronary artery disease Brother 42   Colon cancer Neg Hx    Stomach cancer Neg Hx    Breast cancer Neg Hx    Osteoporosis Neg Hx    Thyroid disease Neg Hx    Social History   Social History Narrative   Not on file    ROS  Constitutional:  Negative for chills, fever, malaise/fatigue and weight loss.  HENT:  Negative for hearing loss, sinus pain and sore throat.   Respiratory:  Negative for cough, hemoptysis and shortness of breath.   Cardiovascular:  Negative for chest pain, palpitations, leg swelling and PND.  Gastrointestinal:  Negative for abdominal pain, constipation, diarrhea, heartburn, nausea and vomiting.  Genitourinary:  Negative for dysuria, frequency and urgency.  Musculoskeletal:  Negative for back pain, myalgias and neck pain.  Skin:  Negative for itching and rash.  Neurological:  Negative for dizziness, tingling, seizures and headaches.  Endo/Heme/Allergies:  Negative for polydipsia.  Psychiatric/Behavioral:  Negative for depression. The patient is not nervous/anxious.      Objective:   Vitals: BP 120/80 (BP Location: Left Arm, Patient Position: Sitting)  Pulse 84   Ht 5' (1.524 m)   Wt 161 lb (73 kg)   SpO2 97%   BMI 31.44 kg/m    Physical Exam Cardiovascular:     Rate and Rhythm: Normal rate and regular rhythm. No extrasystoles are present.    Pulses: Normal pulses.     Heart sounds: Normal heart sounds. No murmur heard.    No friction rub. No gallop.  Pulmonary:     Effort: Pulmonary effort is normal.     Breath sounds: Normal breath sounds. No wheezing, rhonchi or rales.  Musculoskeletal:      Right lower leg: No edema.     Left lower leg: No edema.    Constitutional:      General: She is not in acute distress.    Appearance: Normal appearance. She is not ill-appearing or toxic-appearing.  HENT:     Head: Normocephalic and atraumatic. Neurological:     General: No focal deficit present.     Mental Status: She is alert and oriented to person, place, and time. Mental status is at baseline. Psychiatric:        Attention and Perception: Attention normal.        Mood and Affect: Mood normal.        Speech: Speech normal.        Behavior: Behavior normal.        Thought Content: Thought content normal.        Cognition and Memory: Cognition normal.        Judgment: Judgment normal.  Results:   Studies obtained and personally reviewed by me:  10/21/23 HbgA1c 6.1. Hepatic function panel WNL.    Mammogram most recently completed 09/23/23 with findings of resolution of the asymmetry questioned on screening mammogram (09/09/23) in the left breast. Otherwise no mammographic evidence of malignancy; recommended repeat 1 year (2025).   Labs:       Component Value Date/Time   NA 141 04/19/2023 0906   K 4.2 04/19/2023 0906   CL 103 04/19/2023 0906   CO2 27 04/19/2023 0906   GLUCOSE 100 (H) 04/19/2023 0906   BUN 20 04/19/2023 0906   CREATININE 0.55 (L) 04/19/2023 0906   CALCIUM 9.6 04/19/2023 0906   PROT 6.6 10/21/2023 0933   ALBUMIN 3.6 11/18/2022 0438   AST 15 10/21/2023 0933   ALT 13 10/21/2023 0933   ALKPHOS 64 11/17/2022 1330   BILITOT 0.5 10/21/2023 0933   GFRNONAA >60 11/18/2022 0438   GFRNONAA 87 03/15/2020 0908   GFRAA 101 03/15/2020 0908    Lab Results  Component Value Date   WBC 5.8 04/19/2023   HGB 13.1 04/19/2023   HCT 39.4 04/19/2023   MCV 86.4 04/19/2023   PLT 245 04/19/2023   Lab Results  Component Value Date   CHOL 206 (H) 10/21/2023   HDL 66 10/21/2023   LDLCALC 114 (H) 10/21/2023   TRIG 144 10/21/2023   CHOLHDL 3.1 10/21/2023    Lab  Results  Component Value Date   HGBA1C 6.1 (H) 10/21/2023     Lab Results  Component Value Date   TSH 1.37 04/19/2023       Assessment & Plan:   Hypertension: treated with metoprolol succinate 25 mg daily with or immediately following meal, losartan 100 mg daily. Blood pressure today 120/80, improved compared to 158/74. Denied LE edema.  Hyperlipidemia: treated with simvastatin 20 mg daily with supper. 10/21/23 lipid panel resulted cholesterol 206, LDL 114, and non-HDL cholesterol 140, while TRIG improved  to 144 from 169 on 04/19/23.    GERD: treated with omeprazole 20 mg daily before breakfast. Tried stopping this due to diarrhea but diarrhea did not resolve.    Osteoporosis: Taking calcium carbonate 600 mg daily in the morning. Last bone density study was in 2020.  Has been treated by Dr. Lucianne Muss with Prolia in the past but has stopped these injections.  She takes vitamin D supplements.  Before Prolia, she took Fosamax for a long time for age-related osteoporosis.  Has not had recent bone density study.  Last one was in 2020 and T score was -2.8. She declines bone density testing at this time.   Anemia: treated with ferrous sulfate 325 mg daily in the morning.Longstanding med   Vitamin D deficiency: Taking Vitamin D supplement daily  Mammogram: Completed 09/23/23 with findings of resolution of the asymmetry questioned on screening mammogram (09/09/23) in the left breast. Otherwise no mammographic evidence of malignancy; recommended repeat 1 year (2025).   Vaccine Counseling: UTD on flu and pneumonia; discussed covid and she decided she will get hers at her pharmacy  Return in 6 months for your follow-up, or as needed!   I,Emily Lagle,acting as a Neurosurgeon for Margaree Mackintosh, MD.,have documented all relevant documentation on the behalf of Margaree Mackintosh, MD,as directed by  Margaree Mackintosh, MD while in the presence of Margaree Mackintosh, MD.   I, Margaree Mackintosh, MD, have reviewed all documentation  for this visit. The documentation on 10/28/23 for the exam, diagnosis, procedures, and orders are all accurate and complete.

## 2023-10-28 ENCOUNTER — Ambulatory Visit (INDEPENDENT_AMBULATORY_CARE_PROVIDER_SITE_OTHER): Payer: Medicare Other | Admitting: Internal Medicine

## 2023-10-28 ENCOUNTER — Encounter: Payer: Self-pay | Admitting: Internal Medicine

## 2023-10-28 VITALS — BP 120/80 | HR 84 | Ht 60.0 in | Wt 161.0 lb

## 2023-10-28 DIAGNOSIS — K219 Gastro-esophageal reflux disease without esophagitis: Secondary | ICD-10-CM

## 2023-10-28 DIAGNOSIS — E781 Pure hyperglyceridemia: Secondary | ICD-10-CM

## 2023-10-28 DIAGNOSIS — E559 Vitamin D deficiency, unspecified: Secondary | ICD-10-CM

## 2023-10-28 DIAGNOSIS — M818 Other osteoporosis without current pathological fracture: Secondary | ICD-10-CM

## 2023-10-28 DIAGNOSIS — I10 Essential (primary) hypertension: Secondary | ICD-10-CM

## 2023-10-28 DIAGNOSIS — R7302 Impaired glucose tolerance (oral): Secondary | ICD-10-CM

## 2023-10-28 NOTE — Patient Instructions (Addendum)
It was a pleasure to see you today.  Hemoglobin A1c stable at 6.1%.  Continue to work with diet and exercise.  Mild elevation of LDL cholesterol at 114 with less than 100 being ideal.  Liver panel is normal on statin medication.  Patient due for update on tetanus immunization and can have this in the next few months.

## 2023-12-12 ENCOUNTER — Other Ambulatory Visit: Payer: Self-pay | Admitting: Internal Medicine

## 2024-02-11 ENCOUNTER — Other Ambulatory Visit: Payer: Self-pay

## 2024-02-11 ENCOUNTER — Encounter (HOSPITAL_COMMUNITY): Payer: Self-pay

## 2024-02-11 ENCOUNTER — Observation Stay (HOSPITAL_COMMUNITY)
Admission: EM | Admit: 2024-02-11 | Discharge: 2024-02-12 | Disposition: A | Discharge status: Home / Self Care | Attending: Cardiology | Admitting: Cardiology

## 2024-02-11 ENCOUNTER — Emergency Department (HOSPITAL_COMMUNITY)

## 2024-02-11 DIAGNOSIS — I10 Essential (primary) hypertension: Secondary | ICD-10-CM | POA: Diagnosis present

## 2024-02-11 DIAGNOSIS — I471 Supraventricular tachycardia, unspecified: Secondary | ICD-10-CM | POA: Insufficient documentation

## 2024-02-11 DIAGNOSIS — Z79899 Other long term (current) drug therapy: Secondary | ICD-10-CM | POA: Diagnosis not present

## 2024-02-11 DIAGNOSIS — I517 Cardiomegaly: Secondary | ICD-10-CM | POA: Insufficient documentation

## 2024-02-11 DIAGNOSIS — I2510 Atherosclerotic heart disease of native coronary artery without angina pectoris: Secondary | ICD-10-CM | POA: Diagnosis not present

## 2024-02-11 DIAGNOSIS — R079 Chest pain, unspecified: Secondary | ICD-10-CM | POA: Diagnosis present

## 2024-02-11 DIAGNOSIS — E785 Hyperlipidemia, unspecified: Secondary | ICD-10-CM | POA: Diagnosis present

## 2024-02-11 DIAGNOSIS — R002 Palpitations: Secondary | ICD-10-CM | POA: Diagnosis not present

## 2024-02-11 DIAGNOSIS — Z87891 Personal history of nicotine dependence: Secondary | ICD-10-CM | POA: Diagnosis not present

## 2024-02-11 DIAGNOSIS — R7989 Other specified abnormal findings of blood chemistry: Secondary | ICD-10-CM | POA: Diagnosis not present

## 2024-02-11 DIAGNOSIS — I4719 Other supraventricular tachycardia: Secondary | ICD-10-CM | POA: Diagnosis not present

## 2024-02-11 DIAGNOSIS — R0789 Other chest pain: Secondary | ICD-10-CM | POA: Diagnosis present

## 2024-02-11 LAB — BASIC METABOLIC PANEL WITH GFR
Anion gap: 12 (ref 5–15)
BUN: 14 mg/dL (ref 8–23)
CO2: 22 mmol/L (ref 22–32)
Calcium: 9.6 mg/dL (ref 8.9–10.3)
Chloride: 104 mmol/L (ref 98–111)
Creatinine, Ser: 0.71 mg/dL (ref 0.44–1.00)
GFR, Estimated: >60 mL/min (ref >60)
Glucose, Bld: 111 mg/dL — ABNORMAL HIGH (ref 70–99)
Potassium: 4.1 mmol/L (ref 3.5–5.1)
Sodium: 138 mmol/L (ref 135–145)

## 2024-02-11 LAB — CBC
HCT: 41.3 % (ref 36.0–46.0)
HCT: 37.7 % (ref 36.0–46.0)
Hemoglobin: 13.5 g/dL (ref 12.0–15.0)
Hemoglobin: 12.4 g/dL (ref 12.0–15.0)
MCH: 28.8 pg (ref 26.0–34.0)
MCH: 28.8 pg (ref 26.0–34.0)
MCHC: 32.7 g/dL (ref 30.0–36.0)
MCHC: 32.9 g/dL (ref 30.0–36.0)
MCV: 88.1 fL (ref 80.0–100.0)
MCV: 87.5 fL (ref 80.0–100.0)
Platelets: 285 10*3/uL (ref 150–400)
Platelets: 259 10*3/uL (ref 150–400)
RBC: 4.69 MIL/uL (ref 3.87–5.11)
RBC: 4.31 MIL/uL (ref 3.87–5.11)
RDW: 13 % (ref 11.5–15.5)
RDW: 12.9 % (ref 11.5–15.5)
WBC: 7.4 10*3/uL (ref 4.0–10.5)
WBC: 8.5 10*3/uL (ref 4.0–10.5)
nRBC: 0 % (ref 0.0–0.2)
nRBC: 0 % (ref 0.0–0.2)

## 2024-02-11 LAB — D-DIMER, QUANTITATIVE: D-Dimer, Quant: 0.4 ug{FEU}/mL (ref 0.00–0.50)

## 2024-02-11 LAB — TROPONIN I (HIGH SENSITIVITY)
Troponin I (High Sensitivity): 86 ng/L — ABNORMAL HIGH (ref <18)
Troponin I (High Sensitivity): 129 ng/L (ref <18)
Troponin I (High Sensitivity): 170 ng/L (ref <18)

## 2024-02-11 LAB — CREATININE, SERUM
Creatinine, Ser: 0.56 mg/dL (ref 0.44–1.00)
GFR, Estimated: >60 mL/min (ref >60)

## 2024-02-11 MED ORDER — FERROUS SULFATE 325 (65 FE) MG PO TABS
325.0000 mg | ORAL_TABLET | Freq: Every morning | ORAL | Status: DC
  Administered 2024-02-12: 325 mg via ORAL
  Filled 2024-02-11: qty 1

## 2024-02-11 MED ORDER — LOSARTAN POTASSIUM 50 MG PO TABS
100.0000 mg | ORAL_TABLET | Freq: Every day | ORAL | Status: DC
  Administered 2024-02-12: 100 mg via ORAL
  Filled 2024-02-11: qty 2

## 2024-02-11 MED ORDER — ACETAMINOPHEN 325 MG PO TABS: 650.0000 mg | ORAL_TABLET | ORAL | Status: DC | PRN

## 2024-02-11 MED ORDER — NITROGLYCERIN 0.4 MG SL SUBL: 0.4000 mg | SUBLINGUAL_TABLET | SUBLINGUAL | Status: DC | PRN

## 2024-02-11 MED ORDER — ASPIRIN 81 MG PO CHEW
324.0000 mg | CHEWABLE_TABLET | Freq: Once | ORAL | Status: AC
  Administered 2024-02-11: 324 mg via ORAL
  Filled 2024-02-11: qty 4

## 2024-02-11 MED ORDER — ENOXAPARIN SODIUM 40 MG/0.4ML IJ SOSY
40.0000 mg | PREFILLED_SYRINGE | INTRAMUSCULAR | Status: DC
Start: 2024-02-12 — End: 2024-02-12
  Filled 2024-02-11: qty 0.4

## 2024-02-11 MED ORDER — ONDANSETRON HCL 4 MG/2ML IJ SOLN: 4.0000 mg | Freq: Four times a day (QID) | INTRAMUSCULAR | Status: DC | PRN

## 2024-02-11 MED ORDER — ASPIRIN 81 MG PO TBEC
81.0000 mg | DELAYED_RELEASE_TABLET | Freq: Every day | ORAL | Status: DC
  Administered 2024-02-12: 81 mg via ORAL
  Filled 2024-02-11: qty 1

## 2024-02-11 MED ORDER — SIMVASTATIN 20 MG PO TABS: 20.0000 mg | ORAL_TABLET | Freq: Every day | ORAL | Status: DC

## 2024-02-11 NOTE — ED Provider Triage Note (Signed)
 Emergency Medicine Provider Triage Evaluation Note  Linda Sosa , a 78 y.o. female  was evaluated in triage.  Pt complains of chest pain.  Review of Systems  Positive:  Negative:   Physical Exam  BP (!) 163/79 (BP Location: Right Arm)   Pulse (!) 101   Temp 98.7 F (37.1 C)   Resp 16   Ht 5\' 1"  (1.549 m)   Wt 72.6 kg   SpO2 97%   BMI 30.23 kg/m  Gen:   Awake, no distress   Resp:  Normal effort  MSK:   Moves extremities without difficulty  Other:    Medical Decision Making  Medically screening exam initiated at 2:34 PM.  Appropriate orders placed.  LIVVY Sosa was informed that the remainder of the evaluation will be completed by another provider, this initial triage assessment does not replace that evaluation, and the importance of remaining in the ED until their evaluation is complete.  Hx of heart cath around 1 year ago that was clear. Patient with palpitations that turned into chest tightness for around 3 hours before coming into ED. Symptoms are starting to feel a little better - patient has not taken any medications for her symptoms. She also felt a little "queezy" so she ate some crackers - but her symptoms persisted. Also endorsed some SOB when she was becoming anxious about possibly coming to ED.  Denies fever, cough, nausea, vomiting, diarrhea.    Linda Sosa, [place] 02/11/24 [phone]

## 2024-02-11 NOTE — ED Provider Notes (Signed)
  EMERGENCY DEPARTMENT AT [facility] Provider Note  CSN: [id number] Arrival date & time: 02/11/24 1311  Chief Complaint(s) Chest Pain  HPI Linda Sosa is a 78 y.o. female history of hypertension, hyperlipidemia presenting to the emergency department episode of chest pain.  Patient reports chest pain started this morning, associated with palpitations, mild shortness of breath.  Symptoms have improved but still present.  Reports similar episode around 1 year ago, had catheterization, unsure of results.  No recent travel, surgery.  No leg swelling.  No history of DVT or PE.  Reports some nausea but no vomiting.  No fevers or chills.  No lightheadedness or dizziness.  No syncope.   Past Medical History Past Medical History:  Diagnosis Date   Anemia    Hyperlipidemia    Hypertension    Osteoporosis    Vitamin D deficiency    Patient Active Problem List   Diagnosis Date Noted   Palpitations 11/18/2022   Abnormal cardiac enzyme level 11/18/2022   Chest pain 11/17/2022   DNR (do not resuscitate) 11/17/2022   Primary hypertension 06/26/2014   Anemia 11/18/2012   Osteoporosis 01/09/2012   H/O vitamin D deficiency 01/09/2012   Hyperlipidemia 11/20/2011   Home Medication(s) Prior to Admission medications   Medication Sig Start Date End Date Taking? Authorizing Provider  Acetaminophen (TYLENOL PO) Take 2 tablets by mouth daily as needed (pain, fever, headache).   Yes [provider]  calcium carbonate (OS-CAL) 600 MG TABS Take 600 mg by mouth in the morning.   Yes [provider]  Cholecalciferol (VITAMIN D-3 PO) Take 1 capsule by mouth in the morning.   Yes [provider]  Cranberry-Vitamin C-Probiotic (AZO CRANBERRY PO) Take 1 tablet by mouth in the morning and at bedtime.   Yes [provider]  ferrous sulfate 325 (65 FE) MG tablet Take 325 mg by mouth in the morning.   Yes [provider]  losartan (COZAAR) 100 MG  tablet TAKE 1 TABLET BY MOUTH EVERY DAY Patient taking differently: Take 100 mg by mouth in the morning. 08/07/23  Yes Baxley, Luanna Cole, MD  Multiple Vitamins-Minerals (MULTIVITAMIN WITH MINERALS) tablet Take 1 tablet by mouth in the morning.   Yes [provider]  simvastatin (ZOCOR) 20 MG tablet TAKE 1 TABLET BY MOUTH EVERYDAY AT BEDTIME Patient taking differently: Take 20 mg by mouth at bedtime. 12/13/23  Yes Baxley, Luanna Cole, MD                                                                                                                                    Past Surgical History Past Surgical History:  Procedure Laterality Date   BREAST EXCISIONAL BIOPSY Right 1991   BREAST EXCISIONAL BIOPSY Right 1971   LAPAROSCOPIC CHOLECYSTECTOMY  1997   LEFT HEART CATH AND CORONARY ANGIOGRAPHY N/A 11/18/2022   Procedure: LEFT HEART CATH AND CORONARY ANGIOGRAPHY;  Surgeon:  Yates Decamp, MD;  Location: [facility] INVASIVE CV LAB;  Service: Cardiovascular;  Laterality: N/A;   TONSILLECTOMY  1952   Family History Family History  Problem Relation Age of Onset   Cancer Mother    Diabetes Father    Heart disease Father    Coronary artery disease Father 30   Coronary artery disease Brother 64   Colon cancer Neg Hx    Stomach cancer Neg Hx    Breast cancer Neg Hx    Osteoporosis Neg Hx    Thyroid disease Neg Hx     Social History Social History   Tobacco Use   Smoking status: Former    Current packs/day: 0.00    Average packs/day: 1 pack/day for 5.0 years (5.0 ttl pk-yrs)    Types: Cigarettes    Start date: 11/30/1978    Quit date: 12/01/1983    Years since quitting: 40.2   Smokeless tobacco: Never  Substance Use Topics   Alcohol use: No   Drug use: No   Allergies Patient has no known allergies.  Review of Systems Review of Systems  All other systems reviewed and are negative.   Physical Exam Vital Signs  I have reviewed the triage vital signs BP (!) 130/58   Pulse 88   Temp 98.5 F  (36.9 C) (Oral)   Resp 19   Ht 5\' 1"  (1.549 m)   Wt 72.6 kg   SpO2 95%   BMI 30.23 kg/m  Physical Exam Vitals and nursing note reviewed.  Constitutional:      General: She is not in acute distress.    Appearance: She is well-developed.  HENT:     Head: Normocephalic and atraumatic.     Mouth/Throat:     Mouth: Mucous membranes are moist.  Eyes:     Pupils: Pupils are equal, round, and reactive to light.  Cardiovascular:     Rate and Rhythm: Normal rate and regular rhythm.     Pulses:          Radial pulses are 2+ on the right side and 2+ on the left side.     Heart sounds: No murmur heard. Pulmonary:     Effort: Pulmonary effort is normal. No respiratory distress.     Breath sounds: Normal breath sounds.  Abdominal:     General: Abdomen is flat.     Palpations: Abdomen is soft.     Tenderness: There is no abdominal tenderness.  Musculoskeletal:        General: No tenderness.     Right lower leg: No edema.     Left lower leg: No edema.  Skin:    General: Skin is warm and dry.  Neurological:     General: No focal deficit present.     Mental Status: She is alert. Mental status is at baseline.  Psychiatric:        Mood and Affect: Mood normal.        Behavior: Behavior normal.     ED Results and Treatments Labs (all labs ordered are listed, but only abnormal results are displayed) Labs Reviewed  BASIC METABOLIC PANEL WITH GFR - Abnormal; Notable for the following components:      Result Value   Glucose, Bld 111 (*)    All other components within normal limits  TROPONIN I (HIGH SENSITIVITY) - Abnormal; Notable for the following components:   Troponin I (High Sensitivity) 86 (*)    All other components within normal limits  TROPONIN I (  HIGH SENSITIVITY) - Abnormal; Notable for the following components:   Troponin I (High Sensitivity) 129 (*)    All other components within normal limits  CBC  D-DIMER, QUANTITATIVE  CBC  CREATININE, SERUM  LIPID PANEL   COMPREHENSIVE METABOLIC PANEL WITH GFR  TROPONIN I (HIGH SENSITIVITY)                                                                                                                          Radiology DG Chest 2 View Result Date: 02/11/2024 CLINICAL DATA:  Chest pain and cardiac palpitations. EXAM: CHEST - 2 VIEW COMPARISON:  November 17, 2022. FINDINGS: The heart size and mediastinal contours are within normal limits. Stable probable hiatal hernia. Both lungs are clear. The visualized skeletal structures are unremarkable. IMPRESSION: No active cardiopulmonary disease.  Stable probable hiatal hernia. Electronically Signed   By: Lupita Raider M.D.   On: 02/11/2024 14:33    Pertinent labs & imaging results that were available during my care of the patient were reviewed by me and considered in my medical decision making (see MDM for details).  Medications Ordered in ED Medications  losartan (COZAAR) tablet 100 mg (has no administration in time range)  simvastatin (ZOCOR) tablet 20 mg (has no administration in time range)  ferrous sulfate tablet 325 mg (has no administration in time range)  aspirin EC tablet 81 mg (has no administration in time range)  nitroGLYCERIN (NITROSTAT) SL tablet 0.4 mg (has no administration in time range)  acetaminophen (TYLENOL) tablet 650 mg (has no administration in time range)  ondansetron (ZOFRAN) injection 4 mg (has no administration in time range)  enoxaparin (LOVENOX) injection 40 mg (has no administration in time range)  aspirin chewable tablet 324 mg (324 mg Oral Given 02/11/24 1453)                                                                                                                                     Procedures .Critical Care  Performed by: Lonell Grandchild, MD Authorized by: Lonell Grandchild, MD   Critical care provider statement:    Critical care time (minutes):  30   Critical care was necessary to treat or prevent imminent or  life-threatening deterioration of the following conditions:  Cardiac failure   Critical care was time spent personally by me on the following activities:  Development of treatment plan with  patient or surrogate, discussions with consultants, evaluation of patient's response to treatment, examination of patient, ordering and review of laboratory studies, ordering and review of radiographic studies, ordering and performing treatments and interventions, pulse oximetry, re-evaluation of patient's condition and review of old charts   Care discussed with: admitting provider     (including critical care time)  Medical Decision Making / ED Course   MDM:  78 year old presenting with chest pain.  Patient overall well-appearing, physical examination without focal finding.  EKG reassuring.  Considered ACS, however patient had an left heart catheterization around 1 year ago which was relatively normal.  Differential also includes other causes of chest pain such as pulmonary embolism, will check D-dimer.  Lower concern for dissection, equal pulses, stable cardiomediastinal silhouette.  Chest x-ray without pneumonia, pneumothorax.  EKG in sinus rhythm.  Will reassess.  Will obtain delta troponin.  Consider possible arrhythmia as precipitating cause given palpitations and normal catheterization.  Clinical Course as of 02/11/24 1925  Fri Feb 11, 2024  1649 DG Chest 2 View [WS]  1724 Troponin uptrending.  129.  Discussed with cardiology.  They will come and see patient.  D-dimer is negative.  Wonder if patient had a arrhythmia earlier as a cause of symptoms. [WS]  1923 Admitted to cardiology.  [WS]    Clinical Course User Index [WS] Suezanne Jacquet, Jerilee Field, MD     Additional history obtained: -Additional history obtained from family -External records from outside source obtained and reviewed including: Chart review including previous notes, labs, imaging, consultation notes including prior cardiac workup     Lab Tests: -I ordered, reviewed, and interpreted labs.   The pertinent results include:   Labs Reviewed  BASIC METABOLIC PANEL WITH GFR - Abnormal; Notable for the following components:      Result Value   Glucose, Bld 111 (*)    All other components within normal limits  TROPONIN I (HIGH SENSITIVITY) - Abnormal; Notable for the following components:   Troponin I (High Sensitivity) 86 (*)    All other components within normal limits  TROPONIN I (HIGH SENSITIVITY) - Abnormal; Notable for the following components:   Troponin I (High Sensitivity) 129 (*)    All other components within normal limits  CBC  D-DIMER, QUANTITATIVE  CBC  CREATININE, SERUM  LIPID PANEL  COMPREHENSIVE METABOLIC PANEL WITH GFR  TROPONIN I (HIGH SENSITIVITY)    Notable for elevated troponin  EKG   EKG Interpretation Date/Time:  Friday February 11 2024 13:16:10 EDT Ventricular Rate:  99 PR Interval:  126 QRS Duration:  74 QT Interval:  338 QTC Calculation: 433 R Axis:   52  Text Interpretation: Normal sinus rhythm Normal ECG When compared with ECG of 17-Nov-2022 11:00, No significant change since last tracing Confirmed by Benjiman Core [id number]) on 02/11/2024 2:36:42 PM         Imaging Studies ordered: I ordered imaging studies including CXR On my interpretation imaging demonstrates no acute process I independently visualized and interpreted imaging. I agree with the radiologist interpretation   Medicines ordered and prescription drug management: Meds ordered this encounter  Medications   aspirin chewable tablet 324 mg   losartan (COZAAR) tablet 100 mg   simvastatin (ZOCOR) tablet 20 mg   ferrous sulfate tablet 325 mg   aspirin EC tablet 81 mg   nitroGLYCERIN (NITROSTAT) SL tablet 0.4 mg   acetaminophen (TYLENOL) tablet 650 mg   ondansetron (ZOFRAN) injection 4 mg   enoxaparin (LOVENOX) injection 40 mg    -  I have reviewed the patients home medicines and have made adjustments as  needed   Consultations Obtained: I requested consultation with the cardiologist,  and discussed lab and imaging findings as well as pertinent plan - they recommend: admit    Cardiac Monitoring: The patient was maintained on a cardiac monitor.  I personally viewed and interpreted the cardiac monitored which showed an underlying rhythm of: NSR  Social Determinants of Health:  Diagnosis or treatment significantly limited by social determinants of health: obesity   Reevaluation: After the interventions noted above, I reevaluated the patient and found that their symptoms have resolved  Co morbidities that complicate the patient evaluation  Past Medical History:  Diagnosis Date   Anemia    Hyperlipidemia    Hypertension    Osteoporosis    Vitamin D deficiency       Dispostion: Disposition decision including need for hospitalization was considered, and patient admitted to the hospital.    Final Clinical Impression(s) / ED Diagnoses Final diagnoses:  Elevated troponin I level  Chest pain, unspecified type     This chart was dictated using voice recognition software.  Despite best efforts to proofread,  errors can occur which can change the documentation meaning.    Lonell Grandchild, MD 02/11/24 [phone]

## 2024-02-11 NOTE — ED Triage Notes (Signed)
 Pt c/o chest pain and palpitations that started 3 hours ago. C/O shob. Non radiating.

## 2024-02-11 NOTE — H&P (Signed)
 Cardiology Admission History and Physical   Patient ID: Linda Sosa MRN: [id number]; DOB: April 18, 1946   Admission date: 02/11/2024  PCP:  Margaree Mackintosh, MD    [facility] Providers Cardiologist:  Yates Decamp, MD  Click here to update MD or APP on Care Team, Refresh:1}     Chief Complaint:  chest pain and palpitations  Patient Profile:   Linda Sosa is a 78 y.o. female with a history of minimal CAD on cardiac catheterization in 11/2022, hypertension, hyperlipidemia, and anemia who is being seen 02/11/2024 for the evaluation of chest pain and palpitations.  History of Present Illness:   Linda Sosa is a 78 year old female with the above history.  Patient was admitted in 11/2022 for chest pain and palpitations. High-sensitivity troponin minimally elevated in the 30s at that time. Echo showed LVEF of 60-65% with normal wall motion, normal RV function, and no significant valvular disease. LHC showed only minimal disease with 20-30% stenosis of mid LAD. Chest pain felt to possibly be due to esophageal spasm and GERD. Palpiations were felt to be due to PACs/PVCs. She was started on PPI and beta-blocker. She has not been seen by Cardiology since that time.   Patient presented to the ED this afternoon for chest pain and palpitations that started 3 hours ago.  EKG showed normal sinus rhythm, rate 99 bpm, with no acute ischemic changes. High-sensitivity troponin 86 >> 129. D-dimer negative. Chest x-ray showed no acute findings. WBC 7.4, Hgb 13.5, Plts 285. Na 138, K 4.1, Glucose 111, BUN 14, Cr 0.71.   Patient was in her usual state of health until this morning when around 8 AM she had sudden onset of palpitations that she described as a heart pounding sensation more than a fast rate.  She then developed some chest tightness.  The palpitations lasted for about 30 minutes but the chest tightness persisted for couple hours which is when she decided to come to the ED.  She denies any other  chest pain or palpitations since her last top of the hospitalization and 11/2022.  She ports a little shortness of breath this morning during this episode but she thinks it was just due to to her being anxious.  She denies any other shortness of breath.  No orthopnea, PND, edema.  No lightheadedness, dizziness, near syncope/syncope.  She denies any recent fevers or illnesses.  No URI symptoms.  She did have some diarrhea this morning but states she occasionally has this.  No other GI symptoms.  No abnormal bleeding in urine or stools.  She did drink 2 cups of coffee this morning but that is not unusual for her.  She is currently chest pain-free and denies any palpitations.  Past Medical History:  Diagnosis Date   Anemia    Hyperlipidemia    Hypertension    Osteoporosis    Vitamin D deficiency     Past Surgical History:  Procedure Laterality Date   BREAST EXCISIONAL BIOPSY Right 1991   BREAST EXCISIONAL BIOPSY Right 1971   LAPAROSCOPIC CHOLECYSTECTOMY  1997   LEFT HEART CATH AND CORONARY ANGIOGRAPHY N/A 11/18/2022   Procedure: LEFT HEART CATH AND CORONARY ANGIOGRAPHY;  Surgeon: Yates Decamp, MD;  Location: [facility];  Service: Cardiovascular;  Laterality: N/A;   TONSILLECTOMY  1952     Medications Prior to Admission: Prior to Admission medications   Medication Sig Start Date End Date Taking? Authorizing Provider  Acetaminophen (TYLENOL PO) Take 2 tablets by  mouth daily as needed (pain, fever, headache).    [provider]  calcium carbonate (OS-CAL) 600 MG TABS Take 600 mg by mouth in the morning.    [provider]  Cholecalciferol (VITAMIN D-3 PO) Take 1 capsule by mouth in the morning.    [provider]  ferrous sulfate 325 (65 FE) MG tablet Take 325 mg by mouth in the morning.    [provider]  losartan (COZAAR) 100 MG tablet TAKE 1 TABLET BY MOUTH EVERY DAY 08/07/23   Margaree Mackintosh, MD  Multiple Vitamins-Minerals (MULTIVITAMIN WITH  MINERALS) tablet Take 1 tablet by mouth in the morning.    [provider]  simvastatin (ZOCOR) 20 MG tablet TAKE 1 TABLET BY MOUTH EVERYDAY AT BEDTIME 12/13/23   Margaree Mackintosh, MD     Allergies:   No Known Allergies  Social History:   Social History   Socioeconomic History   Marital status: Widowed    Spouse name: Not on file   Number of children: Not on file   Years of education: Not on file   Highest education level: Associate degree: occupational, Scientist, product/process development, or vocational program  Occupational History   Occupation: retired  Tobacco Use   Smoking status: Former    Current packs/day: 0.00    Average packs/day: 1 pack/day for 5.0 years (5.0 ttl pk-yrs)    Types: Cigarettes    Start date: 11/30/1978    Quit date: 12/01/1983    Years since quitting: 40.2   Smokeless tobacco: Never  Substance and Sexual Activity   Alcohol use: No   Drug use: No   Sexual activity: Not on file  Other Topics Concern   Not on file  Social History Narrative   Not on file   Social Drivers of Health   Financial Resource Strain: Low Risk  (10/21/2023)   Overall Financial Resource Strain (CARDIA)    Difficulty of Paying Living Expenses: Not hard at all  Food Insecurity: No Food Insecurity (10/21/2023)   Hunger Vital Sign    Worried About Running Out of Food in the Last Year: Never true    Ran Out of Food in the Last Year: Never true  Transportation Needs: No Transportation Needs (10/21/2023)   PRAPARE - Administrator, Civil Service (Medical): No    Lack of Transportation (Non-Medical): No  Physical Activity: Insufficiently Active (10/21/2023)   Exercise Vital Sign    Days of Exercise per Week: 2 days    Minutes of Exercise per Session: 10 min  Stress: No Stress Concern Present (10/21/2023)   [employer] of Occupational Health - Occupational Stress Questionnaire    Feeling of Stress : Not at all  Social Connections: Socially Isolated (10/21/2023)   Social Connection  and Isolation Panel [NHANES]    Frequency of Communication with Friends and Family: More than three times a week    Frequency of Social Gatherings with Friends and Family: More than three times a week    Attends Religious Services: Never    Database administrator or Organizations: No    Attends Banker Meetings: Not on file    Marital Status: Widowed  Intimate Partner Violence: Not At Risk (04/17/2022)   Humiliation, Afraid, Rape, and Kick questionnaire    Fear of Current or Ex-Partner: No    Emotionally Abused: No    Physically Abused: No    Sexually Abused: No    Family History:   The patient's family history  includes Cancer in her mother; Coronary artery disease (age of onset: 41) in her brother; Coronary artery disease (age of onset: 100) in her father; Diabetes in her father; Heart disease in her father. There is no history of Colon cancer, Stomach cancer, Breast cancer, Osteoporosis, or Thyroid disease.    ROS:  Please see the history of present illness.   Physical Exam/Data:   Vitals:   02/11/24 1515 02/11/24 1530 02/11/24 1545 02/11/24 1600  BP: 134/75 129/71 130/64 (!) 104/49  Pulse: 77 87 74 71  Resp: 15 19 15 15   Temp:      SpO2: 96% 97% 97% 96%  Weight:      Height:       No intake or output data in the 24 hours ending 02/11/24 1820    02/11/2024    1:18 PM 10/28/2023    9:12 AM 04/22/2023   10:08 AM  Last 3 Weights  Weight (lbs) 160 lb 161 lb 159 lb 4 oz  Weight (kg) 72.576 kg 73.029 kg 72.235 kg     Body mass index is 30.23 kg/m.  General: 78 y.o. Caucasian female resting comfortably in no acute distress. HEENT: Normocephalic and atraumatic. Sclera clear.  Neck: Supple.Marland Kitchen No JVD. Heart: RRR. Distinct S1 and S2. No murmurs, gallops, or rubs. Radial and distal pedal pulses 2+ and equal bilaterally. Lungs: No increased work of breathing. Clear to ausculation bilaterally. No wheezes, rhonchi, or rales.  Abdomen: Soft, non-distended, and non-tender to  palpation. Extremities: No lower extremity edema.    Skin: Warm and dry. Neuro: No focal deficits. Psych: Normal affect. Responds appropriately.    EKG:  The ECG that was done was personally reviewed and demonstrates normal sinus rhythm, rate 99 bpm, with no acute ischemic changes.   Relevant CV Studies:  Echocardiogram 11/18/2022: Impressions: 1. Left ventricular ejection fraction, by estimation, is 60 to 65%. The  left ventricle has normal function. The left ventricle has no regional  wall motion abnormalities. Left ventricular diastolic parameters were  normal.   2. Right ventricular systolic function is normal. The right ventricular  size is normal. Tricuspid regurgitation signal is inadequate for assessing  PA pressure.   3. The mitral valve is normal in structure. Trivial mitral valve  regurgitation. No evidence of mitral stenosis.   4. The aortic valve is tricuspid. Aortic valve regurgitation is not  visualized. Aortic valve sclerosis/calcification is present, without any  evidence of aortic stenosis.   5. The inferior vena cava is normal in size with greater than 50%  respiratory variability, suggesting right atrial pressure of 3 mmHg.  _______________  Left Cardiac Catheterization 11/18/2022: LV: 1 and 26/3, EDP 11 mmHg.  Ao 132/59, mean 93 mmHg.  No pressure gradient across the aortic valve. RCA: Nondominant, small and normal. LM: Large-caliber vessel, smooth and normal. LAD: Large-caliber vessel, gives origin to 2 moderate to large size D1 and D2, proximal to mid segment of LAD has mild calcific 20 to 30% stenosis. LCx: Dominant.  Smooth and normal.      Impression: Chest pain noncardiac etiology, probably related to esophageal spasm and GERD.  Minimally elevated troponin nonspecific at 30.0.  No indication for non-STEMI.  Patient will be discharged home today.  She will follow-up with her PCP. Patient was started on omeprazole for possible GERD, metoprolol succinate  25 mg daily for palpitations, if symptoms of palpitation persist, she can contact us for further evaluation.   Laboratory Data:  High Sensitivity Troponin:   Recent Labs  Lab 02/11/24 1321 02/11/24 1511  TROPONINIHS 86* 129*      Chemistry Recent Labs  Lab 02/11/24 1321  NA 138  K 4.1  CL 104  CO2 22  GLUCOSE 111*  BUN 14  CREATININE 0.71  CALCIUM 9.6  GFRNONAA >60  ANIONGAP 12    No results for input(s): "PROT", "ALBUMIN", "AST", "ALT", "ALKPHOS", "BILITOT" in the last 168 hours. Lipids No results for input(s): "CHOL", "TRIG", "HDL", "LABVLDL", "LDLCALC", "CHOLHDL" in the last 168 hours. Hematology Recent Labs  Lab 02/11/24 1321  WBC 7.4  RBC 4.69  HGB 13.5  HCT 41.3  MCV 88.1  MCH 28.8  MCHC 32.7  RDW 13.0  PLT 285   Thyroid No results for input(s): "TSH", "FREET4" in the last 168 hours. BNPNo results for input(s): "BNP", "PROBNP" in the last 168 hours.  DDimer  Recent Labs  Lab 02/11/24 1545  DDIMER 0.40     Radiology/Studies:  DG Chest 2 View Result Date: 02/11/2024 CLINICAL DATA:  Chest pain and cardiac palpitations. EXAM: CHEST - 2 VIEW COMPARISON:  November 17, 2022. FINDINGS: The heart size and mediastinal contours are within normal limits. Stable probable hiatal hernia. Both lungs are clear. The visualized skeletal structures are unremarkable. IMPRESSION: No active cardiopulmonary disease.  Stable probable hiatal hernia. Electronically Signed   By: Lupita Raider M.D.   On: 02/11/2024 14:33     Assessment and Plan:   Palpitations Chest Pain Minimal Non-Obstructive CAD Elevated Troponin Patient presented with palpitations with associated chest tightness as described above.  She presented with similar symptoms last year in 11/2022.  LHC at that time showed only minimal CAD.  Palpitations were felt to be due to PACs/PVCs.  She was discharged on a beta-blocker but it sounds like she only took this for a short time and stopped when the prescription  ran out.  EKG this admission shows sinus rhythm with no acute ischemic changes.  High-sensitivity troponin mildly elevated at 86 >> 129.  D-dimer negative. - Patient is currently chest pain-free and denies any palpitations. - Telemetry shows normal sinus rhythm with no ectopy.  - Will trend troponin to peak. - Will update Echo. - Continue statin. - Based on presentation, wonder whether patient had some arrhythmia that cause some troponin leak. Do no suspect ACS with only minimal disease noted on cath a little over 1 year ago. Can hold off on IV Heparin for now unless next troponin is significant higher. Will check Echo as above. If normal LV function and wall motion on Echo and troponin stable, anticipate patient can be discharge with outpatient monitor. Will hold on starting beta-blocker for now given given no ectopy on telemetry.  Hypertension BP somewhat labile in the ED with systolic BP ranging from 104 to 163.  - Continue home Losartan 100mg  daily.  Hyperlipidemia LDL 114 in 10/2023.  - Continue Simvastatin 20mg  daily. - Will repeat fasting lipid panel in the morning. If LDL still goal of 70, can consider switching to high-intensity statin given CAD.   Risk Assessment/Risk Scores:   TIMI Risk Score for Unstable Angina or Non-ST Elevation MI:   The patient's TIMI risk score is 3, which indicates a 13% risk of all cause mortality, new or recurrent myocardial infarction or need for urgent revascularization in the next 14 days.{   Code Status: Full Code  Severity of Illness: The appropriate patient status for this patient is OBSERVATION. Observation status is judged to be reasonable and necessary in order to  provide the required intensity of service to ensure the patient's safety. The patient's presenting symptoms, physical exam findings, and initial radiographic and laboratory data in the context of their medical condition is felt to place them at decreased risk for further clinical  deterioration. Furthermore, it is anticipated that the patient will be medically stable for discharge from the hospital within 2 midnights of admission.    For questions or updates, please contact [facility] Please consult [email or web address] for contact info under     Signed, Corrin Parker, PA-C  02/11/2024 6:20 PM

## 2024-02-11 NOTE — ED Notes (Signed)
Report given to 6E Rn.

## 2024-02-12 ENCOUNTER — Observation Stay (HOSPITAL_BASED_OUTPATIENT_CLINIC_OR_DEPARTMENT_OTHER)

## 2024-02-12 ENCOUNTER — Other Ambulatory Visit (HOSPITAL_COMMUNITY): Payer: Self-pay

## 2024-02-12 DIAGNOSIS — Z79899 Other long term (current) drug therapy: Secondary | ICD-10-CM | POA: Diagnosis not present

## 2024-02-12 DIAGNOSIS — R0789 Other chest pain: Principal | ICD-10-CM

## 2024-02-12 DIAGNOSIS — I10 Essential (primary) hypertension: Secondary | ICD-10-CM | POA: Diagnosis not present

## 2024-02-12 DIAGNOSIS — I471 Supraventricular tachycardia, unspecified: Secondary | ICD-10-CM | POA: Diagnosis not present

## 2024-02-12 DIAGNOSIS — I2510 Atherosclerotic heart disease of native coronary artery without angina pectoris: Secondary | ICD-10-CM | POA: Diagnosis not present

## 2024-02-12 DIAGNOSIS — I517 Cardiomegaly: Secondary | ICD-10-CM | POA: Insufficient documentation

## 2024-02-12 DIAGNOSIS — R079 Chest pain, unspecified: Secondary | ICD-10-CM | POA: Diagnosis not present

## 2024-02-12 LAB — LIPID PANEL
Cholesterol: 186 mg/dL (ref 0–200)
HDL: 57 mg/dL (ref >40)
LDL Cholesterol: 112 mg/dL — ABNORMAL HIGH (ref 0–99)
Total CHOL/HDL Ratio: 3.3 ratio
Triglycerides: 85 mg/dL (ref <150)
VLDL: 17 mg/dL (ref 0–40)

## 2024-02-12 LAB — COMPREHENSIVE METABOLIC PANEL WITH GFR
ALT: 14 U/L (ref 0–44)
AST: 19 U/L (ref 15–41)
Albumin: 3.4 g/dL — ABNORMAL LOW (ref 3.5–5.0)
Alkaline Phosphatase: 49 U/L (ref 38–126)
Anion gap: 8 (ref 5–15)
BUN: 15 mg/dL (ref 8–23)
CO2: 27 mmol/L (ref 22–32)
Calcium: 9.2 mg/dL (ref 8.9–10.3)
Chloride: 106 mmol/L (ref 98–111)
Creatinine, Ser: 0.64 mg/dL (ref 0.44–1.00)
GFR, Estimated: >60 mL/min (ref >60)
Glucose, Bld: 111 mg/dL — ABNORMAL HIGH (ref 70–99)
Potassium: 4 mmol/L (ref 3.5–5.1)
Sodium: 141 mmol/L (ref 135–145)
Total Bilirubin: 0.5 mg/dL (ref 0.0–1.2)
Total Protein: 5.7 g/dL — ABNORMAL LOW (ref 6.5–8.1)

## 2024-02-12 LAB — MAGNESIUM: Magnesium: 1.9 mg/dL (ref 1.7–2.4)

## 2024-02-12 LAB — ECHOCARDIOGRAM COMPLETE
Area-P 1/2: 3.91 cm2
Height: 61 in
S' Lateral: 2.8 cm
Weight: 2635.2 [oz_av]

## 2024-02-12 LAB — TSH: TSH: 2.153 u[IU]/mL (ref 0.350–4.500)

## 2024-02-12 MED ORDER — EZETIMIBE 10 MG PO TABS
10.0000 mg | ORAL_TABLET | Freq: Every day | ORAL | 5 refills | Status: DC
  Filled 2024-02-12: qty 30, 30d supply, fill #0

## 2024-02-12 MED ORDER — METOPROLOL SUCCINATE ER 25 MG PO TB24
25.0000 mg | ORAL_TABLET | Freq: Every day | ORAL | Status: DC
  Administered 2024-02-12: 25 mg via ORAL
  Filled 2024-02-12: qty 1

## 2024-02-12 MED ORDER — EZETIMIBE 10 MG PO TABS
10.0000 mg | ORAL_TABLET | Freq: Every day | ORAL | Status: DC
Start: 2024-02-12 — End: 2024-02-12
  Administered 2024-02-12: 10 mg via ORAL
  Filled 2024-02-12: qty 1

## 2024-02-12 MED ORDER — METOPROLOL SUCCINATE ER 25 MG PO TB24
25.0000 mg | ORAL_TABLET | Freq: Every day | ORAL | 5 refills | Status: DC
  Filled 2024-02-12: qty 30, 30d supply, fill #0

## 2024-02-12 MED ORDER — ORAL CARE MOUTH RINSE: 15.0000 mL | OROMUCOSAL | Status: DC | PRN

## 2024-02-12 NOTE — Discharge Instructions (Signed)
 Linda Sosa

## 2024-02-12 NOTE — Discharge Summary (Signed)
 Discharge Summary    Patient ID: Linda Sosa MRN: [id number]; DOB: [birth year]  Admit date: 02/11/2024 Discharge date: 02/12/2024  PCP:  Linda Mackintosh, MD   [facility] Providers Cardiologist:  Linda Decamp, MD        Discharge Diagnoses    Principal Problem:   Chest pain Active Problems:   Hyperlipidemia   Primary hypertension   PSVT (paroxysmal supraventricular tachycardia) (HCC)   Left ventricular hypertrophy  Diagnostic Studies/Procedures    2d echo 02/12/24   1. Left ventricular ejection fraction, by estimation, is 65 to 70%. Left  ventricular ejection fraction by PLAX is 68 %. The left ventricle has  normal function. The left ventricle has no regional wall motion  abnormalities. There is moderate asymmetric  left ventricular hypertrophy of the basal-septal segment. Left ventricular  diastolic parameters are consistent with Grade I diastolic dysfunction  (impaired relaxation).   2. Right ventricular systolic function is hyperdynamic. The right  ventricular size is normal. There is normal pulmonary artery systolic  pressure. The estimated right ventricular systolic pressure is 27.2 mmHg.   3. The mitral valve is grossly normal. Trivial mitral valve  regurgitation.   4. The aortic valve is tricuspid. Aortic valve regurgitation is not  visualized. No aortic stenosis is present.   Comparison(s): Changes from prior study are noted. 11/18/2022: LVEF 60-65%.   _____________   History of Present Illness     Linda Sosa is a 78 y.o. female with minimal CAD on cardiac catheterization in 11/2022, hypertension, hyperlipidemia, and anemia who presented to [facility] with palpitations and found to have elevated troponin.  Patient was previously admitted in 11/2022 for chest pain and palpitations. High-sensitivity troponin minimally elevated in the 30s at that time. Echo showed LVEF of 60-65% with normal wall motion, normal RV function, and no significant valvular  disease. LHC showed only minimal disease with 20-30% stenosis of mid LAD. Chest pain felt to possibly be due to esophageal spasm and GERD. Palpiations were felt to be due to PACs/PVCs. She was started on PPI and beta-blocker. She has not been seen by Cardiology since that time.    Patient presented to the ED yesterday for chest tightness and palpitations that started 3 hours prior.  EKG showed normal sinus rhythm, rate 99 bpm, with no acute ischemic changes. High-sensitivity troponin 86 >> 129. D-dimer negative. Chest x-ray showed no acute findings. WBC 7.4, Hgb 13.5, Plts 285. Na 138, K 4.1, Glucose 111, BUN 14, Cr 0.71. She was admitted to the hospital for further evaluation.  Hospital Course     1. Palpitations, chest pain,  history of minimal non-obstructive CAD, elevated troponin, PSVT - hsTroponins relatively low at 86-129-170, not felt due to ACS - patient was noted to have narrow complex tachycardia overnight, felt to be cause of her symptoms and elevated troponin (suspected demand ischemia)  - echo showed EF 65-70%, moderate asymmetric left ventricular hypertrophy of the basal-septal segment - per Dr. Rennis Sosa without obstruction but aliasing, suggestive of increased output - no longer on BB prior to admission, started on Toprol 25mg   - TSH and Mg pending at time of DC, otherwise potassium normal - per Dr. Rennis Sosa, does not need ASA at DC   2. Hypertension, LVH BP somewhat labile in the ED with systolic BP ranging from 104 to 163.  - Continue home Losartan 100mg  daily - Follow with new addition of Toprol - Follow up clinically  3. Hyperlipidemia LDL 114 in 10/2023.  -  Continue Simvastatin 20mg  daily. - Zetia added for goal LDL <70 - If the patient is tolerating new addition at time of follow-up appointment, would consider rechecking liver function/lipid panel in 6-8 weeks  Dr. Rennis Sosa has seen and examined the patient today and feels she is stable for discharge.      Did the patient  have an acute coronary syndrome (MI, NSTEMI, STEMI, etc) this admission?:  No.   The elevated Troponin was due to the acute medical illness (demand ischemia).         _____________  Discharge Vitals Blood pressure 132/69, pulse 67, temperature 98.1 F (36.7 C), temperature source Oral, resp. rate 17, height 5\' 1"  (1.549 m), weight 74.7 kg, SpO2 98%.  Filed Weights   02/11/24 1318 02/11/24 2031  Weight: 72.6 kg 74.7 kg    Labs & Radiologic Studies    CBC Recent Labs    02/11/24 1321 02/11/24 2026  WBC 7.4 8.5  HGB 13.5 12.4  HCT 41.3 37.7  MCV 88.1 87.5  PLT 285 259   Basic Metabolic Panel Recent Labs    04/54/09 1321 02/11/24 2026 02/12/24 0423  NA 138  --  141  K 4.1  --  4.0  CL 104  --  106  CO2 22  --  27  GLUCOSE 111*  --  111*  BUN 14  --  15  CREATININE 0.71 0.56 0.64  CALCIUM 9.6  --  9.2   Liver Function Tests Recent Labs    02/12/24 0423  AST 19  ALT 14  ALKPHOS 49  BILITOT 0.5  PROT 5.7*  ALBUMIN 3.4*   No results for input(s): "LIPASE", "AMYLASE" in the last 72 hours. High Sensitivity Troponin:   Recent Labs  Lab 02/11/24 1321 02/11/24 1511 02/11/24 1826  TROPONINIHS 86* 129* 170*    BNP Invalid input(s): "POCBNP" D-Dimer Recent Labs    02/11/24 1545  DDIMER 0.40   Hemoglobin A1C No results for input(s): "HGBA1C" in the last 72 hours. Fasting Lipid Panel Recent Labs    02/12/24 0423  CHOL 186  HDL 57  LDLCALC 112*  TRIG 85  CHOLHDL 3.3   Thyroid Function Tests No results for input(s): "TSH", "T4TOTAL", "T3FREE", "THYROIDAB" in the last 72 hours.  Invalid input(s): "FREET3" _____________  ECHOCARDIOGRAM COMPLETE Result Date: 02/12/2024    ECHOCARDIOGRAM REPORT   Patient Name:   Linda Sosa Date of Exam: 02/12/2024 Medical Rec #:  [id number]        Height:       61.0 in Accession #:    [id number]       Weight:       164.7 lb Date of Birth:  Apr 09, 1946       BSA:          1.739 m Patient Age:    77 years         BP:            132/69 mmHg Patient Gender: F                HR:           82 bpm. Exam Location:  Inpatient Procedure: 2D Echo, Cardiac Doppler and Color Doppler (Both Spectral and Color            Flow Doppler were utilized during procedure). Indications:    Chest Pain R07.9  History:        Patient has prior history of Echocardiogram examinations, most  recent 11/18/2022. Signs/Symptoms:Chest Pain; Risk                 Factors:Hypertension and Dyslipidemia.  Sonographer:    Rosaland Lao Referring Phys: [id number] CALLIE E GOODRICH  Sonographer Comments: Image acquisition challenging due to respiratory motion. IMPRESSIONS  1. Left ventricular ejection fraction, by estimation, is 65 to 70%. Left ventricular ejection fraction by PLAX is 68 %. The left ventricle has normal function. The left ventricle has no regional wall motion abnormalities. There is moderate asymmetric left ventricular hypertrophy of the basal-septal segment. Left ventricular diastolic parameters are consistent with Grade I diastolic dysfunction (impaired relaxation).  2. Right ventricular systolic function is hyperdynamic. The right ventricular size is normal. There is normal pulmonary artery systolic pressure. The estimated right ventricular systolic pressure is 27.2 mmHg.  3. The mitral valve is grossly normal. Trivial mitral valve regurgitation.  4. The aortic valve is tricuspid. Aortic valve regurgitation is not visualized. No aortic stenosis is present. Comparison(s): Changes from prior study are noted. 11/18/2022: LVEF 60-65%. FINDINGS  Left Ventricle: Left ventricular ejection fraction, by estimation, is 65 to 70%. Left ventricular ejection fraction by PLAX is 68 %. The left ventricle has normal function. The left ventricle has no regional wall motion abnormalities. The left ventricular internal cavity size was normal in size. There is moderate asymmetric left ventricular hypertrophy of the basal-septal segment. Left ventricular  diastolic parameters are consistent with Grade I diastolic dysfunction (impaired relaxation). Indeterminate filling pressures. Right Ventricle: The right ventricular size is normal. No increase in right ventricular wall thickness. Right ventricular systolic function is hyperdynamic. There is normal pulmonary artery systolic pressure. The tricuspid regurgitant velocity is 2.46 m/s, and with an assumed right atrial pressure of 3 mmHg, the estimated right ventricular systolic pressure is 27.2 mmHg. Left Atrium: Left atrial size was normal in size. Right Atrium: Right atrial size was normal in size. Pericardium: There is no evidence of pericardial effusion. Mitral Valve: The mitral valve is grossly normal. Trivial mitral valve regurgitation. Tricuspid Valve: The tricuspid valve is grossly normal. Tricuspid valve regurgitation is trivial. Aortic Valve: The aortic valve is tricuspid. Aortic valve regurgitation is not visualized. No aortic stenosis is present. Pulmonic Valve: The pulmonic valve was grossly normal. Pulmonic valve regurgitation is trivial. Aorta: The aortic root and ascending aorta are structurally normal, with no evidence of dilitation. IAS/Shunts: No atrial level shunt detected by color flow Doppler.  LEFT VENTRICLE PLAX 2D LV EF:         Left            Diastology                ventricular     LV e' medial:    8.81 cm/s                ejection        LV E/e' medial:  11.0                fraction by     LV e' lateral:   10.30 cm/s                PLAX is 68      LV E/e' lateral: 9.4                %. LVIDd:         4.50 cm LVIDs:         2.80 cm LV PW:  1.10 cm LV IVS:        1.10 cm LVOT diam:     1.90 cm LV SV:         74 LV SV Index:   42 LVOT Area:     2.84 cm  RIGHT VENTRICLE             IVC RV S prime:     24.80 cm/s  IVC diam: 1.30 cm TAPSE (M-mode): 2.1 cm LEFT ATRIUM             Index LA diam:        3.40 cm 1.96 cm/m LA Vol (A2C):   37.7 ml 21.68 ml/m LA Vol (A4C):   35.9 ml 20.64 ml/m  LA Biplane Vol: 38.8 ml 22.31 ml/m  AORTIC VALVE             PULMONIC VALVE LVOT Vmax:   121.00 cm/s PR End Diast Vel: 4.33 msec LVOT Vmean:  78.000 cm/s LVOT VTI:    0.260 m  AORTA Ao Root diam: 2.60 cm Ao Asc diam:  3.00 cm MITRAL VALVE               TRICUSPID VALVE MV Area (PHT): 3.91 cm    TR Peak grad:   24.2 mmHg MV Decel Time: 194 msec    TR Vmax:        246.00 cm/s MV E velocity: 97.30 cm/s MV A velocity: 86.20 cm/s  SHUNTS MV E/A ratio:  1.13        Systemic VTI:  0.26 m                            Systemic Diam: 1.90 cm Zoila Shutter MD Electronically signed by Zoila Shutter MD Signature Date/Time: 02/12/2024/9:36:05 AM    Final    DG Chest 2 View Result Date: 02/11/2024 CLINICAL DATA:  Chest pain and cardiac palpitations. EXAM: CHEST - 2 VIEW COMPARISON:  November 17, 2022. FINDINGS: The heart size and mediastinal contours are within normal limits. Stable probable hiatal hernia. Both lungs are clear. The visualized skeletal structures are unremarkable. IMPRESSION: No active cardiopulmonary disease.  Stable probable hiatal hernia. Electronically Signed   By: Lupita Raider M.D.   On: 02/11/2024 14:33   Disposition   Pt is being discharged home today in good condition.  Follow-up Plans & Appointments     Follow-up Information     Genelda Roark PA-C Follow up.   Why: [facility] - follow-up appointment scheduled Tuesday Mar 07, 2024 8:25 AM (Arrive by 8:10 AM). Kriste Basque is one of the PAs that works with our team.  IMPORTANT: We are currently in the process of transitioning from two locations to one.  Effective March 06, 2024, all appointments that were previously scheduled at either our [facility] or [place] locations will be moved to our new location at [address] [place], [place], [address].  The phone number for our new location will be [phone].   Your visit summary may still list the old address below, but THIS SPECIFIC APPOINTMENT is at [address].                Discharge Instructions     Diet - low sodium heart healthy   Complete by: As directed    Discharge instructions   Complete by: As directed    You were started on a medicine called metoprolol to help suppress fast heart rates/"SVT."  Dr. Rennis Sosa also recommended starting a new medicine called ezetimibe for cholesterol. We will discuss when to recheck your labs at your follow-up.   Increase activity slowly   Complete by: As directed         Discharge Medications   Allergies as of 02/12/2024   No Known Allergies      Medication List     TAKE these medications    AZO CRANBERRY PO Take 1 tablet by mouth in the morning and at bedtime.   calcium carbonate 600 MG Tabs tablet Commonly known as: OS-CAL Take 600 mg by mouth in the morning.   ezetimibe 10 MG tablet Commonly known as: ZETIA Take 1 tablet (10 mg total) by mouth daily.   ferrous sulfate 325 (65 FE) MG tablet Take 325 mg by mouth in the morning.   losartan 100 MG tablet Commonly known as: COZAAR TAKE 1 TABLET BY MOUTH EVERY DAY What changed: when to take this   metoprolol succinate 25 MG 24 hr tablet Commonly known as: TOPROL-XL Take 1 tablet (25 mg total) by mouth daily.   multivitamin with minerals tablet Take 1 tablet by mouth in the morning.   simvastatin 20 MG tablet Commonly known as: ZOCOR TAKE 1 TABLET BY MOUTH EVERYDAY AT BEDTIME What changed: See the new instructions.   TYLENOL PO Take 2 tablets by mouth daily as needed (pain, fever, headache).   VITAMIN D-3 PO Take 1 capsule by mouth in the morning.           Outstanding Labs/Studies   Mg, TSH pending at time of DC  Duration of Discharge Encounter: APP Time: 20 minutes   Signed, Laurann Montana, PA-C 02/12/2024, 10:12 AM

## 2024-02-12 NOTE — Progress Notes (Signed)
 Pt given AVS instructions. Paperwork was reviewed, questions answered. Telemetry was removed. IV taken out Site was CDI. Pt left via wheelchair to private vehicle with daughter. Stopped at [facility] to pick up meds.

## 2024-02-12 NOTE — Progress Notes (Signed)
 DAILY PROGRESS NOTE   Patient Name: Linda Sosa Date of Encounter: 02/12/2024 Cardiologist: Yates Decamp, MD  Chief Complaint   No complaints  Patient Profile   Linda Sosa is a 78 y.o. female with a history of minimal CAD on cardiac catheterization in 11/2022, hypertension, hyperlipidemia, and anemia who is being seen 02/11/2024 for the evaluation of chest pain and palpitations.   Subjective   No chest pain. Was not aware of SVT overnight. Noted to have a 1 minute run of narrow complex tachycardia overnight- this has been previously noted. She had stopped her BB. Labs today are not remarkable, troponin trended up (86, 129, 170) - echo pending today. LDL 112.    Objective   Vitals:   02/11/24 2031 02/12/24 0015 02/12/24 0539 02/12/24 0733  BP: (!) 118/59 (!) 118/56 125/64 132/69  Pulse: 81 71 67   Resp: 18 18 18 17   Temp: 98.2 F (36.8 C) 98.2 F (36.8 C) 97.8 F (36.6 C) 98.1 F (36.7 C)  TempSrc: Oral Oral Oral Oral  SpO2: 98% 98% 98%   Weight: 74.7 kg     Height: 5\' 1"  (1.549 m)       Intake/Output Summary (Last 24 hours) at 02/12/2024 0937 Last data filed at 02/12/2024 1610 Gross per 24 hour  Intake 480 ml  Output --  Net 480 ml   Filed Weights   02/11/24 1318 02/11/24 2031  Weight: 72.6 kg 74.7 kg    Physical Exam   General appearance: alert and no distress Lungs: clear to auscultation bilaterally Heart: regular rate and rhythm, S1, S2 normal, no murmur, click, rub or gallop Extremities: extremities normal, atraumatic, no cyanosis or edema Neurologic: Grossly normal  Inpatient Medications    Scheduled Meds:  aspirin EC  81 mg Oral Daily   enoxaparin (LOVENOX) injection  40 mg Subcutaneous Q24H   ferrous sulfate  325 mg Oral q AM   losartan  100 mg Oral Daily   simvastatin  20 mg Oral q1800    Continuous Infusions:   PRN Meds: acetaminophen, nitroGLYCERIN, ondansetron (ZOFRAN) IV, mouth rinse   Labs   Results for orders placed or  performed during the hospital encounter of 02/11/24 (from the past 48 hours)  Basic metabolic panel     Status: Abnormal   Collection Time: 02/11/24  1:21 PM  Result Value Ref Range   Sodium 138 135 - 145 mmol/L   Potassium 4.1 3.5 - 5.1 mmol/L   Chloride 104 98 - 111 mmol/L   CO2 22 22 - 32 mmol/L   Glucose, Bld 111 (H) 70 - 99 mg/dL    Comment: Glucose reference range applies only to samples taken after fasting for at least 8 hours.   BUN 14 8 - 23 mg/dL   Creatinine, Ser 9.60 0.44 - 1.00 mg/dL   Calcium 9.6 8.9 - 45.4 mg/dL   GFR, Estimated >09 >81 mL/min    Comment: (NOTE) Calculated using the CKD-EPI Creatinine Equation (2021)    Anion gap 12 5 - 15    Comment: Performed at [facility], [address] [place], [place] [address]  CBC     Status: None   Collection Time: 02/11/24  1:21 PM  Result Value Ref Range   WBC 7.4 4.0 - 10.5 K/uL   RBC 4.69 3.87 - 5.11 MIL/uL   Hemoglobin 13.5 12.0 - 15.0 g/dL   HCT 82.9 56.2 - 13.0 %   MCV 88.1 80.0 - 100.0  fL   MCH 28.8 26.0 - 34.0 pg   MCHC 32.7 30.0 - 36.0 g/dL   RDW 29.5 62.1 - 30.8 %   Platelets 285 150 - 400 K/uL   nRBC 0.0 0.0 - 0.2 %    Comment: Performed at [facility], [address] [place], [place] [address]  Troponin I (High Sensitivity)     Status: Abnormal   Collection Time: 02/11/24  1:21 PM  Result Value Ref Range   Troponin I (High Sensitivity) 86 (H) <18 ng/L    Comment: (NOTE) Elevated high sensitivity troponin I (hsTnI) values and significant  changes across serial measurements may suggest ACS but many other  chronic and acute conditions are known to elevate hsTnI results.  Refer to the "Links" section for chest pain algorithms and additional  guidance. Performed at [facility], [address], [place], [place] [address]   Troponin I (High Sensitivity)     Status: Abnormal   Collection Time: 02/11/24  3:11 PM  Result Value Ref Range   Troponin I (High Sensitivity) 129 (HH) <18  ng/L    Comment: CRITICAL RESULT CALLED TO, READ BACK BY AND VERIFIED WITH E.VARENHORST,RN @1627  02/11/2024 VANG.J (NOTE) Elevated high sensitivity troponin I (hsTnI) values and significant  changes across serial measurements may suggest ACS but many other  chronic and acute conditions are known to elevate hsTnI results.  Refer to the "Links" section for chest pain algorithms and additional  guidance. Performed at [facility], [address] [place], [place] [address]   D-dimer, quantitative     Status: None   Collection Time: 02/11/24  3:45 PM  Result Value Ref Range   D-Dimer, Quant 0.40 0.00 - 0.50 ug/mL-FEU    Comment: (NOTE) At the manufacturer cut-off value of 0.5 g/mL FEU, this assay has a negative predictive value of 95-100%.This assay is intended for use in conjunction with a clinical pretest probability (PTP) assessment model to exclude pulmonary embolism (PE) and deep venous thrombosis (DVT) in outpatients suspected of PE or DVT. Results should be correlated with clinical presentation. Performed at [facility], [address] [place], [place] [address]   Troponin I (High Sensitivity)     Status: Abnormal   Collection Time: 02/11/24  6:26 PM  Result Value Ref Range   Troponin I (High Sensitivity) 170 (HH) <18 ng/L    Comment: CRITICAL VALUE NOTED.  VALUE IS CONSISTENT WITH PREVIOUSLY REPORTED AND CALLED VALUE. (NOTE) Elevated high sensitivity troponin I (hsTnI) values and significant  changes across serial measurements may suggest ACS but many other  chronic and acute conditions are known to elevate hsTnI results.  Refer to the "Links" section for chest pain algorithms and additional  guidance. Performed at [facility], [address], [place], [place] [address]   CBC     Status: None   Collection Time: 02/11/24  8:26 PM  Result Value Ref Range   WBC 8.5 4.0 - 10.5 K/uL   RBC 4.31 3.87 - 5.11 MIL/uL   Hemoglobin 12.4 12.0 - 15.0 g/dL    HCT 53.6 64.4 - 03.4 %   MCV 87.5 80.0 - 100.0 fL   MCH 28.8 26.0 - 34.0 pg   MCHC 32.9 30.0 - 36.0 g/dL   RDW 74.2 59.5 - 63.8 %   Platelets 259 150 - 400 K/uL   nRBC 0.0 0.0 - 0.2 %    Comment: Performed at [facility], [address] [place], [place] [address]  Creatinine, serum     Status: None   Collection Time: 02/11/24  8:26 PM  Result Value Ref Range   Creatinine, Ser 0.56 0.44 - 1.00 mg/dL   GFR, Estimated >60 >45 mL/min    Comment: (NOTE) Calculated using the CKD-EPI Creatinine Equation (2021) Performed at [facility], [address], [place], [place] [address]   Lipid panel     Status: Abnormal   Collection Time: 02/12/24  4:23 AM  Result Value Ref Range   Cholesterol 186 0 - 200 mg/dL   Triglycerides 85 <191 mg/dL   HDL 57 >47 mg/dL   Total CHOL/HDL Ratio 3.3 RATIO   VLDL 17 0 - 40 mg/dL   LDL Cholesterol 829 (H) 0 - 99 mg/dL    Comment:        Total Cholesterol/HDL:CHD Risk Coronary Heart Disease Risk Table                     Men   Women  1/2 Average Risk   3.4   3.3  Average Risk       5.0   4.4  2 X Average Risk   9.6   7.1  3 X Average Risk  23.4   11.0        Use the calculated Patient Ratio above and the CHD Risk Table to determine the patient's CHD Risk.        ATP III CLASSIFICATION (LDL):  <100     mg/dL   Optimal  562-130  mg/dL   Near or Above                    Optimal  130-159  mg/dL   Borderline  865-784  mg/dL   High  >696     mg/dL   Very High Performed at [facility], [address], [place], [place] [address]   Comprehensive metabolic panel     Status: Abnormal   Collection Time: 02/12/24  4:23 AM  Result Value Ref Range   Sodium 141 135 - 145 mmol/L   Potassium 4.0 3.5 - 5.1 mmol/L   Chloride 106 98 - 111 mmol/L   CO2 27 22 - 32 mmol/L   Glucose, Bld 111 (H) 70 - 99 mg/dL    Comment: Glucose reference range applies only to samples taken after fasting for at least 8 hours.   BUN 15 8 - 23 mg/dL    Creatinine, Ser 4.13 0.44 - 1.00 mg/dL   Calcium 9.2 8.9 - 24.4 mg/dL   Total Protein 5.7 (L) 6.5 - 8.1 g/dL   Albumin 3.4 (L) 3.5 - 5.0 g/dL   AST 19 15 - 41 U/L   ALT 14 0 - 44 U/L   Alkaline Phosphatase 49 38 - 126 U/L   Total Bilirubin 0.5 0.0 - 1.2 mg/dL   GFR, Estimated >01 >02 mL/min    Comment: (NOTE) Calculated using the CKD-EPI Creatinine Equation (2021)    Anion gap 8 5 - 15    Comment: Performed at [facility], [address] [place], [place] [address]    ECG   N/A  Telemetry   Sinus rhythm with run of PSVT overnight - Personally Reviewed  Radiology    ECHOCARDIOGRAM COMPLETE Result Date: 02/12/2024    ECHOCARDIOGRAM REPORT   Patient Name:   Linda Sosa Date of Exam: 02/12/2024 Medical Rec #:  [id number]  Height:       61.0 in Accession #:    [id number]       Weight:       164.7 lb Date of Birth:  February 26, 1946       BSA:          1.739 m Patient Age:    77 years         BP:           132/69 mmHg Patient Gender: F                HR:           82 bpm. Exam Location:  Inpatient Procedure: 2D Echo, Cardiac Doppler and Color Doppler (Both Spectral and Color            Flow Doppler were utilized during procedure). Indications:    Chest Pain R07.9  History:        Patient has prior history of Echocardiogram examinations, most                 recent 11/18/2022. Signs/Symptoms:Chest Pain; Risk                 Factors:Hypertension and Dyslipidemia.  Sonographer:    Rosaland Lao Referring Phys: [id number] CALLIE E GOODRICH  Sonographer Comments: Image acquisition challenging due to respiratory motion. IMPRESSIONS  1. Left ventricular ejection fraction, by estimation, is 65 to 70%. Left ventricular ejection fraction by PLAX is 68 %. The left ventricle has normal function. The left ventricle has no regional wall motion abnormalities. There is moderate asymmetric left ventricular hypertrophy of the basal-septal segment. Left ventricular diastolic parameters are consistent with  Grade I diastolic dysfunction (impaired relaxation).  2. Right ventricular systolic function is hyperdynamic. The right ventricular size is normal. There is normal pulmonary artery systolic pressure. The estimated right ventricular systolic pressure is 27.2 mmHg.  3. The mitral valve is grossly normal. Trivial mitral valve regurgitation.  4. The aortic valve is tricuspid. Aortic valve regurgitation is not visualized. No aortic stenosis is present. Comparison(s): Changes from prior study are noted. 11/18/2022: LVEF 60-65%. FINDINGS  Left Ventricle: Left ventricular ejection fraction, by estimation, is 65 to 70%. Left ventricular ejection fraction by PLAX is 68 %. The left ventricle has normal function. The left ventricle has no regional wall motion abnormalities. The left ventricular internal cavity size was normal in size. There is moderate asymmetric left ventricular hypertrophy of the basal-septal segment. Left ventricular diastolic parameters are consistent with Grade I diastolic dysfunction (impaired relaxation). Indeterminate filling pressures. Right Ventricle: The right ventricular size is normal. No increase in right ventricular wall thickness. Right ventricular systolic function is hyperdynamic. There is normal pulmonary artery systolic pressure. The tricuspid regurgitant velocity is 2.46 m/s, and with an assumed right atrial pressure of 3 mmHg, the estimated right ventricular systolic pressure is 27.2 mmHg. Left Atrium: Left atrial size was normal in size. Right Atrium: Right atrial size was normal in size. Pericardium: There is no evidence of pericardial effusion. Mitral Valve: The mitral valve is grossly normal. Trivial mitral valve regurgitation. Tricuspid Valve: The tricuspid valve is grossly normal. Tricuspid valve regurgitation is trivial. Aortic Valve: The aortic valve is tricuspid. Aortic valve regurgitation is not visualized. No aortic stenosis is present. Pulmonic Valve: The pulmonic valve was  grossly normal. Pulmonic valve regurgitation is trivial. Aorta: The aortic root and ascending aorta are structurally normal, with no evidence of dilitation. IAS/Shunts: No atrial level shunt detected by color flow  Doppler.  LEFT VENTRICLE PLAX 2D LV EF:         Left            Diastology                ventricular     LV e' medial:    8.81 cm/s                ejection        LV E/e' medial:  11.0                fraction by     LV e' lateral:   10.30 cm/s                PLAX is 68      LV E/e' lateral: 9.4                %. LVIDd:         4.50 cm LVIDs:         2.80 cm LV PW:         1.10 cm LV IVS:        1.10 cm LVOT diam:     1.90 cm LV SV:         74 LV SV Index:   42 LVOT Area:     2.84 cm  RIGHT VENTRICLE             IVC RV S prime:     24.80 cm/s  IVC diam: 1.30 cm TAPSE (M-mode): 2.1 cm LEFT ATRIUM             Index LA diam:        3.40 cm 1.96 cm/m LA Vol (A2C):   37.7 ml 21.68 ml/m LA Vol (A4C):   35.9 ml 20.64 ml/m LA Biplane Vol: 38.8 ml 22.31 ml/m  AORTIC VALVE             PULMONIC VALVE LVOT Vmax:   121.00 cm/s PR End Diast Vel: 4.33 msec LVOT Vmean:  78.000 cm/s LVOT VTI:    0.260 m  AORTA Ao Root diam: 2.60 cm Ao Asc diam:  3.00 cm MITRAL VALVE               TRICUSPID VALVE MV Area (PHT): 3.91 cm    TR Peak grad:   24.2 mmHg MV Decel Time: 194 msec    TR Vmax:        246.00 cm/s MV E velocity: 97.30 cm/s MV A velocity: 86.20 cm/s  SHUNTS MV E/A ratio:  1.13        Systemic VTI:  0.26 m                            Systemic Diam: 1.90 cm Zoila Shutter MD Electronically signed by Zoila Shutter MD Signature Date/Time: 02/12/2024/9:36:05 AM    Final    DG Chest 2 View Result Date: 02/11/2024 CLINICAL DATA:  Chest pain and cardiac palpitations. EXAM: CHEST - 2 VIEW COMPARISON:  November 17, 2022. FINDINGS: The heart size and mediastinal contours are within normal limits. Stable probable hiatal hernia. Both lungs are clear. The visualized skeletal structures are unremarkable. IMPRESSION: No active  cardiopulmonary disease.  Stable probable hiatal hernia. Electronically Signed   By: Lupita Raider M.D.   On: 02/11/2024 14:33    Cardiac Studies   See echo above  Assessment   Principal Problem:  Chest pain Active Problems:   PSVT (paroxysmal supraventricular tachycardia) (HCC)   Plan   Ms. Ashenfelter had some narrow complex tachycardia overnight, which appears to be PSVT. She was previously on BB, but it was stopped. Troponin not significantly elevated, likely demand from SVT. Echo today personally reviewed, heart is hyperdynamic - moderate basal septal hypertrophy without obstruction but aliasing, suggestive of increased output. Start Toprol XL 25 mg daily today. LDL 112 - on simvastatin - add zetia 10 mg daily, goal LDL <70. Could be combined with simvastatin as outpatient. Ok to d/c home today. Follow-up with Dr. Jacinto Halim.  Time Spent Directly with Patient:  I have spent a total of 25 minutes with the patient reviewing hospital notes, telemetry, EKGs, labs and examining the patient as well as establishing an assessment and plan that was discussed personally with the patient.  > 50% of time was spent in direct patient care.  Length of Stay:  LOS: 0 days   Chrystie Nose, MD, [facility], FACP  [place]  [facility]  Medical Director of the [facility]  Cardiovascular Risk Reduction [facility] Diplomate of the American Board of Clinical Lipidology Attending Cardiologist  Direct Dial: [phone]  Fax: [phone]  Website:  [email or web address] 02/12/2024, 9:37 AM

## 2024-02-12 NOTE — Plan of Care (Signed)
  Problem: Health Behavior/Discharge Planning: Goal: Ability to manage health-related needs will improve Outcome: Progressing   Problem: Clinical Measurements: Goal: Ability to maintain clinical measurements within normal limits will improve Outcome: Progressing Goal: Will remain free from infection Outcome: Progressing Goal: Respiratory complications will improve Outcome: Progressing Goal: Cardiovascular complication will be avoided Outcome: Progressing   Problem: Activity: Goal: Risk for activity intolerance will decrease Outcome: Progressing   Problem: Coping: Goal: Level of anxiety will decrease Outcome: Progressing   Problem: Safety: Goal: Ability to remain free from injury will improve Outcome: Progressing

## 2024-02-15 ENCOUNTER — Telehealth: Payer: Self-pay | Admitting: *Deleted

## 2024-02-15 NOTE — Progress Notes (Signed)
 Patient Care Team: Sylvan Evener, MD as PCP - General (Internal Medicine) Knox Perl, MD as PCP - Cardiology (Cardiology)  Visit Date: 02/17/24  Subjective:   Chief Complaint  Patient presents with   Hospitalization Follow-up   Vitals:   02/17/24 1415  BP: (!) 152/80  Patient ZO:XWRUEA Linda Sosa, Linda Sosa DOB:1946/05/05,77 y.o. VWU:[id number]   78 y.o. Female presents today for hospital follow-up from 4/4 - 02/12/2024. Patient has a past medical history of Hypertension; PSVT; Left Ventricular Hypertrophy; Chest Pain; Palpitations; Abnormal Cardiac Enzyme Level. Last year, January 9th 2024 she presented with similar symptoms. This year she presented to ED with chest pain and palpitations. Her EKG was normal, Glucose remained elevated at 111 though C-Met was normal otherwise, Troponin I (High Sensitivity) trended upwards from 86 to 129 and then 170, and LDL of 112 though panel was other otherwise normal. CBC, D-Dimer, Creatinine, TSH, Magnesium, and C-MET were normal otherwise. CXR noted no acute cardiopulmonary disease and stable probable hiatal hernia. Echo noted changes from 11/18/2022 with LVEF increasing from 60-65% to 65-70%. Was discharged with Metoprolol  succinate 25 mg daily and Zetia  10 mg daily and returns for follow-up with Dayna Dunn, PA-C on 03/07/2024. She says that she is feeling better, though fatigued with occasional palpitations and chest pressure. Says that prior to her event she had been gardening, moving mulch bags around.  Past Medical History:  Diagnosis Date   Anemia    Hyperlipidemia    Hypertension    Osteoporosis    Vitamin D  deficiency   No Known Allergies  Family History  Problem Relation Age of Onset   Cancer Mother    Diabetes Father    Heart disease Father    Coronary artery disease Father 55   Coronary artery disease Brother 67   Colon cancer Neg Hx    Stomach cancer Neg Hx    Breast cancer Neg Hx    Osteoporosis Neg Hx    Thyroid  disease Neg Hx     Social Hx: Retired. Widow. Resides alone. Drives. Family supportive.  Review of Systems  Constitutional:  Positive for malaise/fatigue.  Cardiovascular:  Positive for palpitations (occasional). Negative for leg swelling.       (+) Chest Pressure     Objective:  Vitals: BP (!) 152/80   Pulse 77   Temp 98 F (36.7 C)   Ht 5\' 1"  (1.549 m)   Wt 165 lb 1.9 oz (74.9 kg)   SpO2 96%   BMI 31.20 kg/m   Physical Exam Vitals and nursing note reviewed.  Constitutional:      General: She is not in acute distress.    Appearance: Normal appearance. She is not toxic-appearing.  HENT:     Head: Normocephalic and atraumatic.  Cardiovascular:     Rate and Rhythm: Normal rate and regular rhythm. No extrasystoles are present.    Pulses: Normal pulses.     Heart sounds: Normal heart sounds. No murmur heard.    No friction rub. No gallop.  Pulmonary:     Effort: Pulmonary effort is normal. No respiratory distress.     Breath sounds: Normal breath sounds. No wheezing or rales.  Skin:    General: Skin is warm and dry.  Neurological:     Mental Status: She is alert and oriented to person, place, and time. Mental status is at baseline.  Psychiatric:        Mood and Affect: Mood normal.        Behavior: Behavior  normal.        Thought Content: Thought content normal.        Judgment: Judgment normal.     Results:  Studies Obtained And Personally Reviewed By Me:  EKG Date/Time 02/11/2024 13:16  Ventricular Rate 99   PR Interval 126  QRS Duration 74  QT Interval 338  QTC Calculation 433  R Axis 52  Text Interpretation Normal Sinus Rhythm Normal ECG when compared to ECG of 11/17/2022 No significant change since last tracing.   Echocardiogram 02/11/2024  1. Left ventricular ejection fraction, by estimation, is 65 to 70% . Left ventricular ejection fraction by PLAX is 68 % . The left ventricle has normal function. The left ventricle has no regional wall motion abnormalities. There is  moderate asymmetric left ventricular hypertrophy of the basal- septal segment. Left ventricular diastolic parameters are consistent with Grade I diastolic dysfunction (impaired relaxation).   2. Right ventricular systolic function is hyperdynamic. The right ventricular size is normal. There is normal pulmonary artery systolic pressure. The estimated right ventricular systolic pressure is 27. 2 mmHg.   3. The mitral valve is grossly normal. Trivial mitral valve regurgitation.   4. The aortic valve is tricuspid. Aortic valve regurgitation is not visualized. No aortic stenosis is present.  Comparison( s) : Changes from prior study are noted. 1/ 10/ 2024: LVEF 60- 65% .  Labs:     Component Value Date/Time   NA 141 02/12/2024 0423   K 4.0 02/12/2024 0423   CL 106 02/12/2024 0423   CO2 27 02/12/2024 0423   GLUCOSE 111 (H) 02/12/2024 0423   BUN 15 02/12/2024 0423   CREATININE 0.64 02/12/2024 0423   CREATININE 0.55 (L) 04/19/2023 0906   CALCIUM  9.2 02/12/2024 0423   PROT 5.7 (L) 02/12/2024 0423   ALBUMIN 3.4 (L) 02/12/2024 0423   AST 19 02/12/2024 0423   ALT 14 02/12/2024 0423   ALKPHOS 49 02/12/2024 0423   BILITOT 0.5 02/12/2024 0423   GFRNONAA >60 02/12/2024 0423   GFRNONAA 87 03/15/2020 0908   GFRAA 101 03/15/2020 0908    Lab Results  Component Value Date   WBC 8.5 02/11/2024   HGB 12.4 02/11/2024   HCT 37.7 02/11/2024   MCV 87.5 02/11/2024   PLT 259 02/11/2024   Lab Results  Component Value Date   CHOL 186 02/12/2024   HDL 57 02/12/2024   LDLCALC 112 (H) 02/12/2024   TRIG 85 02/12/2024   CHOLHDL 3.3 02/12/2024   Lab Results  Component Value Date   HGBA1C 6.1 (H) 10/21/2023    Lab Results  Component Value Date   TSH 2.153 02/12/2024    Recent Results (from the past 2160 hours)  Basic metabolic panel     Status: Abnormal   Collection Time: 02/11/24  1:21 PM  Result Value Ref Range   Sodium 138 135 - 145 mmol/L   Potassium 4.1 3.5 - 5.1 mmol/L   Chloride 104 98  - 111 mmol/L   CO2 22 22 - 32 mmol/L   Glucose, Bld 111 (H) 70 - 99 mg/dL    Comment: Glucose reference range applies only to samples taken after fasting for at least 8 hours.   BUN 14 8 - 23 mg/dL   Creatinine, Ser 1.61 0.44 - 1.00 mg/dL   Calcium  9.6 8.9 - 10.3 mg/dL   GFR, Estimated >09 >60 mL/min    Comment: (NOTE) Calculated using the CKD-EPI Creatinine Equation (2021)    Anion gap 12 5 - 15  Comment: Performed at [facility], [address], [place], [place] [address]  CBC     Status: None   Collection Time: 02/11/24  1:21 PM  Result Value Ref Range   WBC 7.4 4.0 - 10.5 K/uL   RBC 4.69 3.87 - 5.11 MIL/uL   Hemoglobin 13.5 12.0 - 15.0 g/dL   HCT 91.4 78.2 - 95.6 %   MCV 88.1 80.0 - 100.0 fL   MCH 28.8 26.0 - 34.0 pg   MCHC 32.7 30.0 - 36.0 g/dL   RDW 21.3 08.6 - 57.8 %   Platelets 285 150 - 400 K/uL   nRBC 0.0 0.0 - 0.2 %    Comment: Performed at [facility], [address] [place], [place] [address]  Troponin I (High Sensitivity)     Status: Abnormal   Collection Time: 02/11/24  1:21 PM  Result Value Ref Range   Troponin I (High Sensitivity) 86 (H) <18 ng/L    Comment: (NOTE) Elevated high sensitivity troponin I (hsTnI) values and significant  changes across serial measurements may suggest ACS but many other  chronic and acute conditions are known to elevate hsTnI results.  Refer to the "Links" section for chest pain algorithms and additional  guidance. Performed at [facility], [address] [place], [place] [address]   Troponin I (High Sensitivity)     Status: Abnormal   Collection Time: 02/11/24  3:11 PM  Result Value Ref Range   Troponin I (High Sensitivity) 129 (HH) <18 ng/L    Comment: CRITICAL RESULT CALLED TO, READ BACK BY AND VERIFIED WITH E.VARENHORST,RN @1627  02/11/2024 VANG.J (NOTE) Elevated high sensitivity troponin I (hsTnI) values and significant  changes across serial measurements may suggest ACS but many other  chronic  and acute conditions are known to elevate hsTnI results.  Refer to the "Links" section for chest pain algorithms and additional  guidance. Performed at [facility], [address], [place], [place] [address]   D-dimer, quantitative     Status: None   Collection Time: 02/11/24  3:45 PM  Result Value Ref Range   D-Dimer, Quant 0.40 0.00 - 0.50 ug/mL-FEU    Comment: (NOTE) At the manufacturer cut-off value of 0.5 g/mL FEU, this assay has a negative predictive value of 95-100%.This assay is intended for use in conjunction with a clinical pretest probability (PTP) assessment model to exclude pulmonary embolism (PE) and deep venous thrombosis (DVT) in outpatients suspected of PE or DVT. Results should be correlated with clinical presentation. Performed at [facility], [address], [place], [place] [address]   Troponin I (High Sensitivity)     Status: Abnormal   Collection Time: 02/11/24  6:26 PM  Result Value Ref Range   Troponin I (High Sensitivity) 170 (HH) <18 ng/L    Comment: CRITICAL VALUE NOTED.  VALUE IS CONSISTENT WITH PREVIOUSLY REPORTED AND CALLED VALUE. (NOTE) Elevated high sensitivity troponin I (hsTnI) values and significant  changes across serial measurements may suggest ACS but many other  chronic and acute conditions are known to elevate hsTnI results.  Refer to the "Links" section for chest pain algorithms and additional  guidance. Performed at [facility], [address] [place], [place] [address]   CBC     Status: None   Collection Time: 02/11/24  8:26 PM  Result Value Ref Range   WBC 8.5 4.0 - 10.5 K/uL   RBC 4.31 3.87 - 5.11 MIL/uL  Hemoglobin 12.4 12.0 - 15.0 g/dL   HCT 16.1 09.6 - 04.5 %   MCV 87.5 80.0 - 100.0 fL   MCH 28.8 26.0 - 34.0 pg   MCHC 32.9 30.0 - 36.0 g/dL   RDW 40.9 81.1 - 91.4 %   Platelets 259 150 - 400 K/uL   nRBC 0.0 0.0 - 0.2 %    Comment: Performed at [facility], [address], [place], [place]  [address]  Creatinine, serum     Status: None   Collection Time: 02/11/24  8:26 PM  Result Value Ref Range   Creatinine, Ser 0.56 0.44 - 1.00 mg/dL   GFR, Estimated >62 >13 mL/min    Comment: (NOTE) Calculated using the CKD-EPI Creatinine Equation (2021) Performed at [facility], [address] [place], [place] [address]   Lipid panel     Status: Abnormal   Collection Time: 02/12/24  4:23 AM  Result Value Ref Range   Cholesterol 186 0 - 200 mg/dL   Triglycerides 85 <846 mg/dL   HDL 57 >96 mg/dL   Total CHOL/HDL Ratio 3.3 RATIO   VLDL 17 0 - 40 mg/dL   LDL Cholesterol 295 (H) 0 - 99 mg/dL    Comment:        Total Cholesterol/HDL:CHD Risk Coronary Heart Disease Risk Table                     Men   Women  1/2 Average Risk   3.4   3.3  Average Risk       5.0   4.4  2 X Average Risk   9.6   7.1  3 X Average Risk  23.4   11.0        Use the calculated Patient Ratio above and the CHD Risk Table to determine the patient's CHD Risk.        ATP III CLASSIFICATION (LDL):  <100     mg/dL   Optimal  284-132  mg/dL   Near or Above                    Optimal  130-159  mg/dL   Borderline  440-102  mg/dL   High  >725     mg/dL   Very High Performed at [facility], [address], [place], [place] [address]   Comprehensive metabolic panel     Status: Abnormal   Collection Time: 02/12/24  4:23 AM  Result Value Ref Range   Sodium 141 135 - 145 mmol/L   Potassium 4.0 3.5 - 5.1 mmol/L   Chloride 106 98 - 111 mmol/L   CO2 27 22 - 32 mmol/L   Glucose, Bld 111 (H) 70 - 99 mg/dL    Comment: Glucose reference range applies only to samples taken after fasting for at least 8 hours.   BUN 15 8 - 23 mg/dL   Creatinine, Ser 0.34 0.44 - 1.00 mg/dL   Calcium  9.2 8.9 - 10.3 mg/dL   Total Protein 5.7 (L) 6.5 - 8.1 g/dL   Albumin 3.4 (L) 3.5 - 5.0 g/dL   AST 19 15 - 41 U/L   ALT 14 0 - 44 U/L   Alkaline Phosphatase 49 38 - 126 U/L   Total Bilirubin 0.5 0.0 - 1.2 mg/dL   GFR,  Estimated >74 >25 mL/min    Comment: (NOTE) Calculated using the CKD-EPI Creatinine Equation (2021)    Anion gap 8 5 -  15    Comment: Performed at [facility], [address] [place], [place] [address]  TSH     Status: None   Collection Time: 02/12/24  4:28 AM  Result Value Ref Range   TSH 2.153 0.350 - 4.500 uIU/mL    Comment: Performed by a 3rd Generation assay with a functional sensitivity of <=0.01 uIU/mL. Performed at [facility], [address], [place], [place] [address]   Magnesium     Status: None   Collection Time: 02/12/24  4:28 AM  Result Value Ref Range   Magnesium 1.9 1.7 - 2.4 mg/dL    Comment: Performed at [facility], [address], [place], [place] [address]  ECHOCARDIOGRAM COMPLETE     Status: None   Collection Time: 02/12/24  9:00 AM  Result Value Ref Range   Weight 2,635.2 oz   Height 61 in   BP 132/69 mmHg   S' Lateral 2.80 cm   Area-P 1/2 3.91 cm2   Est EF 65 - 70%    Assessment & Plan:   [facility] from [facility]; Palpitations: Her EKG was normal, Troponin I (High Sensitivity) trended upwards from 86 to 129 and then 170, and LDL of 112 though panel was other otherwise normal. CXR noted no acute cardiopulmonary disease and stable probable hiatal hernia. Echo noted changes from 11/18/2022 with LVEF increasing from 60-65% to 65-70%. Was discharged with Metoprolol  succinate 25 mg daily and Zetia  10 mg daily and returns for follow-up with Dayna Dunn, PA-C on 03/07/2024. Today she says she is feeling better, though fatigued with occasional palpitations and chest pressure. Instructed to continue medications prescribed in the hospital and keep follow-up appointment scheduled for 4/29.    I,Emily Lagle,acting as a Neurosurgeon for Sylvan Evener, MD.,have documented all relevant documentation on the behalf of Sylvan Evener, MD,as directed by  Sylvan Evener, MD while in the presence of Sylvan Evener, MD.   I, Sylvan Evener, MD, have  reviewed all documentation for this visit. The documentation on 02/27/24 for the exam, diagnosis, procedures, and orders are all accurate and complete.

## 2024-02-15 NOTE — Telephone Encounter (Signed)
 Leland Johns D, CMA LVM to schedule  Lenord Fellers Luanna Cole, MD  Blima Ledger, CMA Needs hospital follow up visit here.

## 2024-02-17 ENCOUNTER — Ambulatory Visit: Admitting: Internal Medicine

## 2024-02-17 VITALS — BP 138/72 | HR 77 | Temp 98.0°F | Ht 61.0 in | Wt 165.1 lb

## 2024-02-17 DIAGNOSIS — Z09 Encounter for follow-up examination after completed treatment for conditions other than malignant neoplasm: Secondary | ICD-10-CM

## 2024-02-17 DIAGNOSIS — K219 Gastro-esophageal reflux disease without esophagitis: Secondary | ICD-10-CM

## 2024-02-17 DIAGNOSIS — R079 Chest pain, unspecified: Secondary | ICD-10-CM | POA: Diagnosis not present

## 2024-02-17 DIAGNOSIS — R002 Palpitations: Secondary | ICD-10-CM | POA: Diagnosis not present

## 2024-02-17 DIAGNOSIS — R072 Precordial pain: Secondary | ICD-10-CM

## 2024-02-17 DIAGNOSIS — I10 Essential (primary) hypertension: Secondary | ICD-10-CM

## 2024-02-17 DIAGNOSIS — E781 Pure hyperglyceridemia: Secondary | ICD-10-CM

## 2024-02-17 DIAGNOSIS — M818 Other osteoporosis without current pathological fracture: Secondary | ICD-10-CM

## 2024-02-17 DIAGNOSIS — R7302 Impaired glucose tolerance (oral): Secondary | ICD-10-CM

## 2024-02-17 NOTE — Telephone Encounter (Signed)
 Patient has appointment for 02/17/2024

## 2024-02-27 ENCOUNTER — Encounter: Payer: Self-pay | Admitting: Internal Medicine

## 2024-02-27 NOTE — Patient Instructions (Addendum)
 Continue close follow up with Cardiology. You have Medicare wellness visit scheduled here with fasting labs in late June.

## 2024-03-06 NOTE — Progress Notes (Unsigned)
 Cardiology Office Note    Date:  03/07/2024  ID:  Yowanda, Dilbert 20-Apr-1946, MRN [id number] PCP:  Sylvan Evener, MD  Cardiologist:  Knox Perl, MD  Electrophysiologist:  None   Chief Complaint: chest pressure  History of Present Illness: IKEYA RUDD is a 78 y.o. female with visit-pertinent history of minimal CAD on cardiac catheterization in 11/2022, hypertension, hyperlipidemia, anemia, PSVT, LVH seen for post-hospital f/u.  Patient was previously admitted in 11/2022 for chest pain and palpitations. High-sensitivity troponin minimally elevated in the 30s at that time. Echo showed LVEF of 60-65% with normal wall motion, normal RV function, and no significant valvular disease. LHC showed only minimal disease with 20-30% stenosis of mid LAD. Chest pain felt to possibly be due to esophageal spasm and GERD. Palpitations were felt to be due to PACs/PVCs. She was started on PPI and beta-blocker. She was readmitted 02/2024 with chest tightness and palpitations that had started 3 hours prior. hsTroponin was elevated 86->120->170. D-dimer and CXR were unremarkable. Echo showed EF 65-70%, moderate asymmetric left ventricular hypertrophy of the basal-septal segment - per Dr. Maximo Spar without obstruction but aliasing, suggestive of increased output. Overnight she had narrow complex tachycardia c/w SVT, ultimately felt to be the cause of her symptoms. She was not felt to require further inpatient evaluation. She was started on metoprolol . Zetia  was added for HLD.  She is accompanied by her daughter today. She reports that even after discharge she had a fairly persistent chest heaviness that would come and go. One day she lifted bags of mulch and felt fine during this activity, but other times she would notice a slight increase with exertion. She had not felt any recurrent palpitations with these episodes. She had a particularly bad day around [place] and then ever since the last 8-9 days, it eased off  and she's felt back to herself. She has backed off on more strenuous activity but was gardening this week and felt OK. Father died at 16 and brother died at 76 of unclear cardiac causes - she recalls her father had had a lot of palpitations before his passing.  Labwork independently reviewed: 02/2024 Mg 1.9, TSH OK, K 4.0, Cr 0.64, AST ALT OK, LDL 112, trig 85  ROS: .    Please see the history of present illness.  All other systems are reviewed and otherwise negative.  Studies Reviewed: Aaron Aas    EKG:  EKG is ordered today, personally reviewed, demonstrating:  EKG Interpretation Date/Time:  Tuesday March 07 2024 08:44:22 EDT Ventricular Rate:  73 PR Interval:  140 QRS Duration:  72 QT Interval:  378 QTC Calculation: 416 R Axis:   48  Text Interpretation: Normal sinus rhythm Normal ECG When compared with ECG of 12-Feb-2024 09:09, No significant change was found Confirmed by Cataleia Gade [phone]) on 03/07/2024 8:48:39 AM    CV Studies: Cardiac studies reviewed are outlined and summarized above. Otherwise please see EMR for full report.   Current Reported Medications:.    Current Meds  Medication Sig   Acetaminophen  (TYLENOL  PO) Take 2 tablets by mouth daily as needed (pain, fever, headache).   calcium  carbonate (OS-CAL) 600 MG TABS Take 600 mg by mouth in the morning.   Cholecalciferol (VITAMIN D -3 PO) Take 1 capsule by mouth in the morning.   Cranberry-Vitamin C-Probiotic (AZO CRANBERRY PO) Take 1 tablet by mouth in the morning and at bedtime.   ezetimibe  (ZETIA ) 10 MG tablet Take 1 tablet (10  mg total) by mouth daily.   ferrous sulfate  325 (65 FE) MG tablet Take 325 mg by mouth in the morning.   losartan  (COZAAR ) 100 MG tablet TAKE 1 TABLET BY MOUTH EVERY DAY (Patient taking differently: Take 100 mg by mouth in the morning.)   metoprolol  succinate (TOPROL -XL) 25 MG 24 hr tablet Take 1 tablet (25 mg total) by mouth daily.   Multiple Vitamins-Minerals (MULTIVITAMIN WITH MINERALS) tablet  Take 1 tablet by mouth in the morning.   simvastatin  (ZOCOR ) 20 MG tablet TAKE 1 TABLET BY MOUTH EVERYDAY AT BEDTIME (Patient taking differently: Take 20 mg by mouth at bedtime.)    Physical Exam:    VS:  BP 122/66   Pulse 81   Ht 5\' 1"  (1.549 m)   Wt 166 lb (75.3 kg)   SpO2 96%   BMI 31.37 kg/m    Wt Readings from Last 3 Encounters:  03/07/24 166 lb (75.3 kg)  02/17/24 165 lb 1.9 oz (74.9 kg)  02/11/24 164 lb 11.2 oz (74.7 kg)    GEN: Well nourished, well developed in no acute distress NECK: No JVD; No carotid bruits CARDIAC: RRR, very soft SEM, no murmurs, rubs, gallops RESPIRATORY:  Coarse but clear to auscultation without rales, wheezing or rhonchi  ABDOMEN: Soft, non-tender, non-distended EXTREMITIES:  No edema; No acute deformity   Asessement and Plan:.    1. Chest pain of uncertain etiology, elevated troponin, with mild CAD on cor CT 11/2022 as well as moderate asymmetric LVH on recent echocardiogram - recent hospitalization reviewed. Symptoms initially felt possibly due to PSVT, though patient continued to have some pervasiveness of chest pressure without palpitations even after discharge. This came with mixed features, sometimes with exertion and other times not. Thankfully she has felt well over this last week and EKG is normal today. She just started wearing a smart watch a few days ago without any specific alert. Given the recurrent symptoms, her family history, and the elevated troponin value in the hospital, I think it is prudent to move forward with additional testing. We will plan a coronary CTA to exclude progression of CAD as well as 2 week Zio to evaluate SVT burden. I have requested staff rx the standard pre-med Lopressor  per algorithm in place of her AM Toprol  that day. Will also add baby ASA 81mg  daily pending completion of this evaluation. I also think with her family history of premature SCD and LVH she needs evaluation for hypertrophic cardiomyopathy so will also  undertake cMRI. These are typically booked a few months out so I suspect we'll have the above information first. She denies significant claustrophobia or having metal in her body.   2. PSVT - evaluation planned as above. Refill Toprol  25mg  once daily today.  3. Hyperlipidemia - will plan recheck fasting lipids/LFTs mid June 2025 for reassessment. Refill ezetimibe  10mg  daily. Remains on chronic simvastatin  20mg  daily at bedtime as well.  Disposition: F/u with me in 8 weeks.  Signed, Daira Hine N Tauri Ethington, PA-C

## 2024-03-07 ENCOUNTER — Encounter: Payer: Self-pay | Admitting: Physician Assistant

## 2024-03-07 ENCOUNTER — Ambulatory Visit

## 2024-03-07 ENCOUNTER — Ambulatory Visit: Attending: Physician Assistant | Admitting: Physician Assistant

## 2024-03-07 VITALS — BP 122/66 | HR 81 | Ht 61.0 in | Wt 166.0 lb

## 2024-03-07 DIAGNOSIS — R7989 Other specified abnormal findings of blood chemistry: Secondary | ICD-10-CM | POA: Diagnosis not present

## 2024-03-07 DIAGNOSIS — R079 Chest pain, unspecified: Secondary | ICD-10-CM

## 2024-03-07 DIAGNOSIS — E785 Hyperlipidemia, unspecified: Secondary | ICD-10-CM

## 2024-03-07 DIAGNOSIS — I471 Supraventricular tachycardia, unspecified: Secondary | ICD-10-CM

## 2024-03-07 DIAGNOSIS — I517 Cardiomegaly: Secondary | ICD-10-CM

## 2024-03-07 DIAGNOSIS — R072 Precordial pain: Secondary | ICD-10-CM

## 2024-03-07 LAB — CBC

## 2024-03-07 MED ORDER — ASPIRIN 81 MG PO TBEC: 81.0000 mg | DELAYED_RELEASE_TABLET | Freq: Every day | ORAL | Status: DC

## 2024-03-07 MED ORDER — METOPROLOL SUCCINATE ER 25 MG PO TB24: 25.0000 mg | ORAL_TABLET | Freq: Every day | ORAL | 3 refills | Status: DC

## 2024-03-07 MED ORDER — EZETIMIBE 10 MG PO TABS: 10.0000 mg | ORAL_TABLET | Freq: Every day | ORAL | 3 refills | Status: AC

## 2024-03-07 MED ORDER — METOPROLOL TARTRATE 100 MG PO TABS: ORAL_TABLET | ORAL | 0 refills | Status: DC

## 2024-03-07 NOTE — Patient Instructions (Addendum)
 Medication Instructions:  Start Aspirin  81 mg once daily  *If you need a refill on your cardiac medications before your next appointment, please call your pharmacy*  Lab Work: BMET, Mag and CBC today. Fasting lipids and LFT in June If you have labs (blood work) drawn today and your tests are completely normal, you will receive your results only by: MyChart Message (if you have MyChart) OR A paper copy in the mail If you have any lab test that is abnormal or we need to change your treatment, we will call you to review the results.  Testing/Procedures: Cardiac MRI Your physician has requested that you have a cardiac MRI. Cardiac MRI uses a computer to create images of your heart as its beating, producing both still and moving pictures of your heart and major blood vessels. For further information please visit [email or web address]. Please follow the instruction sheet given to you today for more information.  Coronary CTA Your physician has requested that you have cardiac CT. Cardiac computed tomography (CT) is a painless test that uses an x-ray machine to take clear, detailed pictures of your heart. For further information please visit [email or web address] Please follow instruction sheet as given.   14 Day Zio Heart Monitor Your physician has requested that you wear a Zio heart monitor for 14 days. This will be mailed to your home with instructions on how to apply the monitor and how to return it when finished. Please allow 2 weeks after returning the heart monitor before our office calls you with the results.   Follow-Up: At [facility], you and your health needs are our priority.  As part of our continuing mission to provide you with exceptional heart care, our providers are all part of one team.  This team includes your primary Cardiologist (physician) and Advanced Practice Providers or APPs (Physician Assistants and Nurse Practitioners) who all work together to provide you with the  care you need, when you need it.  Your next appointment:   8 weeks  Provider:   Lisabeth Rider  Other Instructions ZIO XT- Long Term Monitor Instructions  Your physician has requested you wear a ZIO patch monitor for 14 days.  This is a single patch monitor. [employer] supplies one patch monitor per enrollment. Additional stickers are not available. Please do not apply patch if you will be having a Nuclear Stress Test,  Echocardiogram, Cardiac CT, MRI, or Chest Xray during the period you would be wearing the  monitor. The patch cannot be worn during these tests. You cannot remove and re-apply the  ZIO XT patch monitor.  Your ZIO patch monitor will be mailed 3 day USPS to your address on file. It may take 3-5 days  to receive your monitor after you have been enrolled.  Once you have received your monitor, please review the enclosed instructions. Your monitor  has already been registered assigning a specific monitor serial # to you.  Billing and Patient Assistance Program Information  We have supplied [employer] with any of your insurance information on file for billing purposes. [employer] offers a sliding scale Patient Assistance Program for patients that do not have  insurance, or whose insurance does not completely cover the cost of the ZIO monitor.  You must apply for the Patient Assistance Program to qualify for this discounted rate.  To apply, please call [employer] at [phone], select option 4, select option 2, ask to apply for  Patient Assistance Program. Sanna Crystal will ask your household income, and how many people  are in your household. They will quote your out-of-pocket cost based on that information.  [employer] will also be able to set up a 34-month, interest-free payment plan if needed.  Applying the monitor Hold abrader disc by orange tab. Rub abrader in 40 strokes over the upper left chest as  indicated in your monitor instructions.  Clean area with 4 enclosed alcohol pads. Let  dry.  Apply patch as indicated in monitor instructions. Patch will be placed under collarbone on left  side of chest with arrow pointing upward.  Rub patch adhesive wings for 2 minutes. Remove white label marked "1". Remove the white  label marked "2". Rub patch adhesive wings for 2 additional minutes.  While looking in a mirror, press and release button in center of patch. A small green light will  flash 3-4 times. This will be your only indicator that the monitor has been turned on.  Do not shower for the first 24 hours. You may shower after the first 24 hours.  Press the button if you feel a symptom. You will hear a small click. Record Date, Time and  Symptom in the Patient Logbook.  When you are ready to remove the patch, follow instructions on the last 2 pages of Patient  Logbook. Stick patch monitor onto the last page of Patient Logbook.  Place Patient Logbook in the blue and white box. Use locking tab on box and tape box closed  securely. The blue and white box has prepaid postage on it. Please place it in the mailbox as  soon as possible. Your physician should have your test results approximately 7 days after the  monitor has been mailed back to [facility].  Call [facility] Customer Care at [phone] if you have questions regarding  your ZIO XT patch monitor. Call them immediately if you see an orange light blinking on your  monitor.  If your monitor falls off in less than 4 days, contact our Monitor department at [phone].  If your monitor becomes loose or falls off after 4 days call [employer] at [phone] for  suggestions on securing your monitor     Your cardiac CT will be scheduled at one of the below locations:   [facility] [address] [place], [place] [address] [phone]  Jeralene Mom. [facility] and Vascular Tower [address]  [place], [place] [address] Opening March 06, 2024  If scheduled at [facility], please arrive  at the [facility] and Children's Entrance (Entrance C2) of [facility] 30 minutes prior to test start time. You can use the FREE valet parking offered at entrance C (encouraged to control the heart rate for the test)  Proceed to the [facility] Radiology Department (first floor) to check-in and test prep.   All radiology patients and guests should use entrance C2 at [facility], accessed from [facility], even though the hospital's physical address listed is [address]    If scheduled at the Heart and Vascular Tower at [address], please enter the parking lot using the [address] entrance and use the FREE valet service at the patient drop-off area. Enter the buidling and check-in with registration on the main floor.  Please follow these instructions carefully (unless otherwise directed):  An IV will be required for this test and Nitroglycerin  will be given.    On the Night Before the Test: Be sure to Drink plenty of water. Do not consume any caffeinated/decaffeinated beverages or  chocolate 12 hours prior to your test. Do not take any antihistamines 12 hours prior to your test.  On the Day of the Test: Drink plenty of water until 1 hour prior to the test. Do not eat any food 1 hour prior to test. You may take your regular medications prior to the test.  Take metoprolol  tartrate (Lopressor ) two hours prior to test.  DO NOT TAKE YOUR USUAL METOPROLOL  SUCCINATE (TOPROL  XL) ON DAY OF THE CT Patients who wear a continuous glucose monitor MUST remove the device prior to scanning. FEMALES- please wear underwire-free bra if available, avoid dresses & tight clothing  After the Test: Drink plenty of water. After receiving IV contrast, you may experience a mild flushed feeling. This is normal. On occasion, you may experience a mild rash up to 24 hours after the test. This is not dangerous. If this occurs, you can take Benadryl 25 mg, Zyrtec,  Claritin, or Allegra and increase your fluid intake. (Patients taking Tikosyn should avoid Benadryl, and may take Zyrtec, Claritin, or Allegra) If you experience trouble breathing, this can be serious. If it is severe call 911 IMMEDIATELY. If it is mild, please call our office.  We will call to schedule your test 2-4 weeks out understanding that some insurance companies will need an authorization prior to the service being performed.   For more information and frequently asked questions, please visit our website : [email or web address]  For non-scheduling related questions, please contact the cardiac imaging nurse navigator should you have any questions/concerns: Cardiac Imaging Nurse Navigators Direct Office Dial: [phone]   For scheduling needs, including cancellations and rescheduling, please call [place], [phone].      Addendum 03/07/24 9:40 am - called and left detailed VM for patient's daughter updating directions for metoprolol  instructions for the CT scan.  Updated After Visit Summary and sent the information through the portal as well.

## 2024-03-07 NOTE — Addendum Note (Signed)
 Addended by: Annalynne Ibanez on: 03/07/2024 09:43 AM   Modules accepted: Orders

## 2024-03-07 NOTE — Progress Notes (Unsigned)
 Enrolled patient for a 14 day Zio XT monitor to be mailed to patients home   [place] to read

## 2024-03-08 ENCOUNTER — Telehealth: Payer: Self-pay | Admitting: *Deleted

## 2024-03-08 LAB — MAGNESIUM: Magnesium: 2.1 mg/dL (ref 1.6–2.3)

## 2024-03-08 LAB — LIPID PANEL
Chol/HDL Ratio: 2.7 ratio (ref 0.0–4.4)
Cholesterol, Total: 163 mg/dL (ref 100–199)
HDL: 60 mg/dL (ref >39)
LDL Chol Calc (NIH): 77 mg/dL (ref 0–99)
Triglycerides: 155 mg/dL — ABNORMAL HIGH (ref 0–149)
VLDL Cholesterol Cal: 26 mg/dL (ref 5–40)

## 2024-03-08 LAB — HEPATIC FUNCTION PANEL
ALT: 14 IU/L (ref 0–32)
AST: 17 IU/L (ref 0–40)
Albumin: 4.3 g/dL (ref 3.8–4.8)
Alkaline Phosphatase: 77 IU/L (ref 44–121)
Bilirubin Total: 0.3 mg/dL (ref 0.0–1.2)
Bilirubin, Direct: 0.13 mg/dL (ref 0.00–0.40)
Total Protein: 6.3 g/dL (ref 6.0–8.5)

## 2024-03-08 LAB — CBC
Hematocrit: 41.3 % (ref 34.0–46.6)
Hemoglobin: 13.2 g/dL (ref 11.1–15.9)
MCH: 28.8 pg (ref 26.6–33.0)
MCHC: 32 g/dL (ref 31.5–35.7)
MCV: 90 fL (ref 79–97)
Platelets: 267 10*3/uL (ref 150–450)
RBC: 4.59 x10E6/uL (ref 3.77–5.28)
RDW: 12.1 % (ref 11.7–15.4)
WBC: 6.8 10*3/uL (ref 3.4–10.8)

## 2024-03-08 LAB — BASIC METABOLIC PANEL WITH GFR
BUN/Creatinine Ratio: 38 — ABNORMAL HIGH (ref 12–28)
BUN: 21 mg/dL (ref 8–27)
CO2: 25 mmol/L (ref 20–29)
Calcium: 9.5 mg/dL (ref 8.7–10.3)
Chloride: 103 mmol/L (ref 96–106)
Creatinine, Ser: 0.56 mg/dL — ABNORMAL LOW (ref 0.57–1.00)
Glucose: 98 mg/dL (ref 70–99)
Potassium: 4.5 mmol/L (ref 3.5–5.2)
Sodium: 141 mmol/L (ref 134–144)
eGFR: 93 mL/min/{1.73_m2} (ref >59)

## 2024-03-08 NOTE — Telephone Encounter (Signed)
-----   Message from Dayna N Dunn sent at 03/08/2024  7:40 AM EDT ----- Please move this note to a phone note so we have notation re: cardiac MRI. Lab accidentally drew LFTs/lipids too early with labs - needed to be done mid June. Please have these cancelled out so she does not get charged and re-do in mid June. Let her know I reviewed with Dr. Berry Bristol who recommends to hold off cardiac MRI at this time, keep plan otherwise as outlined. Please change her appointment from July with me to Dr. Berry Bristol as he would like to review further at that time. Thanks so much!

## 2024-03-08 NOTE — Telephone Encounter (Signed)
 Per Graciela Lava, PA-C, Please move this note to a phone note so we have notation re: cardiac MRI. Lab accidentally drew LFTs/lipids too early with labs - needed to be done mid June. Please have these cancelled out so she does not get charged and re-do in mid June. Let her know I reviewed with Dr. Berry Bristol who recommends to hold off cardiac MRI at this time, keep plan otherwise as outlined. Please change her appointment from July with me to Dr. Berry Bristol as he would like to review further at that time.   Pt has been made aware.

## 2024-03-21 ENCOUNTER — Encounter (HOSPITAL_COMMUNITY): Payer: Self-pay

## 2024-03-23 ENCOUNTER — Ambulatory Visit (HOSPITAL_COMMUNITY)
Admission: RE | Admit: 2024-03-23 | Discharge: 2024-03-23 | Disposition: A | Discharge status: Home / Self Care | Source: Ambulatory Visit | Attending: Physician Assistant | Admitting: Physician Assistant

## 2024-03-23 DIAGNOSIS — R072 Precordial pain: Secondary | ICD-10-CM | POA: Insufficient documentation

## 2024-03-23 MED ORDER — NITROGLYCERIN 0.4 MG SL SUBL
SUBLINGUAL_TABLET | SUBLINGUAL | Status: AC
  Filled 2024-03-23: qty 2

## 2024-03-23 MED ORDER — NITROGLYCERIN 0.4 MG SL SUBL
0.8000 mg | SUBLINGUAL_TABLET | Freq: Once | SUBLINGUAL | Status: AC
  Administered 2024-03-23: 0.8 mg via SUBLINGUAL

## 2024-03-23 MED ORDER — IOHEXOL 350 MG/ML SOLN
100.0000 mL | Freq: Once | INTRAVENOUS | Status: AC | PRN
Start: 2024-03-23 — End: 2024-03-23
  Administered 2024-03-23: 100 mL via INTRAVENOUS

## 2024-03-24 ENCOUNTER — Telehealth: Payer: Self-pay | Admitting: Cardiology

## 2024-03-24 ENCOUNTER — Ambulatory Visit: Payer: Self-pay | Admitting: Physician Assistant

## 2024-03-24 DIAGNOSIS — I517 Cardiomegaly: Secondary | ICD-10-CM

## 2024-03-24 DIAGNOSIS — R7989 Other specified abnormal findings of blood chemistry: Secondary | ICD-10-CM

## 2024-03-24 DIAGNOSIS — E785 Hyperlipidemia, unspecified: Secondary | ICD-10-CM

## 2024-03-24 DIAGNOSIS — R079 Chest pain, unspecified: Secondary | ICD-10-CM

## 2024-03-24 DIAGNOSIS — I471 Supraventricular tachycardia, unspecified: Secondary | ICD-10-CM

## 2024-03-24 NOTE — Telephone Encounter (Signed)
 Spoke with patient and she is aware of CT results verbalized understanding

## 2024-03-24 NOTE — Telephone Encounter (Signed)
 Patient was returning call for results. Please advise

## 2024-03-29 DIAGNOSIS — R079 Chest pain, unspecified: Secondary | ICD-10-CM | POA: Diagnosis not present

## 2024-03-29 DIAGNOSIS — I471 Supraventricular tachycardia, unspecified: Secondary | ICD-10-CM

## 2024-03-30 MED ORDER — METOPROLOL SUCCINATE ER 50 MG PO TB24: 50.0000 mg | ORAL_TABLET | Freq: Every day | ORAL | 3 refills | Status: AC

## 2024-04-04 NOTE — Progress Notes (Signed)
 Her symptoms are clearly related to GI etiology, no further cardiac workup is indicated.  Infective could see her in July and make a as needed, if she would like me to see her happy to see her as well but you could complete and close the loop.

## 2024-04-04 NOTE — Progress Notes (Signed)
 Your symptoms of chest pain are clearly noncardiac that means not related to the heart.  I have recommended that you follow-up with Ms. Dayna Dunn, PA-C for further management but overall you need gastroenterologist to follow-up on your symptoms, I will forward my recommendations to your PCP as well.  I am happy to see you back if needed.  Very large hiatal hernia probable etiology for chest pain.

## 2024-04-05 NOTE — Progress Notes (Signed)
 Do not need MRI and I am fine seeing her or you can. I think her presentation is most consistent with severe hiatal hernia

## 2024-04-09 ENCOUNTER — Other Ambulatory Visit: Payer: Self-pay | Admitting: Internal Medicine

## 2024-04-28 ENCOUNTER — Other Ambulatory Visit: Payer: Medicare Other

## 2024-04-28 DIAGNOSIS — E781 Pure hyperglyceridemia: Secondary | ICD-10-CM

## 2024-04-28 DIAGNOSIS — Z0000 Encounter for general adult medical examination without abnormal findings: Secondary | ICD-10-CM

## 2024-04-28 DIAGNOSIS — M818 Other osteoporosis without current pathological fracture: Secondary | ICD-10-CM

## 2024-04-28 DIAGNOSIS — K580 Irritable bowel syndrome with diarrhea: Secondary | ICD-10-CM

## 2024-04-28 DIAGNOSIS — R7302 Impaired glucose tolerance (oral): Secondary | ICD-10-CM

## 2024-04-28 DIAGNOSIS — I10 Essential (primary) hypertension: Secondary | ICD-10-CM

## 2024-04-29 LAB — COMPLETE METABOLIC PANEL WITHOUT GFR
AG Ratio: 2.2 (calc) (ref 1.0–2.5)
ALT: 11 U/L (ref 6–29)
AST: 17 U/L (ref 10–35)
Albumin: 4.3 g/dL (ref 3.6–5.1)
Alkaline phosphatase (APISO): 65 U/L (ref 37–153)
BUN: 21 mg/dL (ref 7–25)
CO2: 27 mmol/L (ref 20–32)
Calcium: 9.5 mg/dL (ref 8.6–10.4)
Chloride: 104 mmol/L (ref 98–110)
Creat: 0.71 mg/dL (ref 0.60–1.00)
Globulin: 2 g/dL (ref 1.9–3.7)
Glucose, Bld: 101 mg/dL — ABNORMAL HIGH (ref 65–99)
Potassium: 4.7 mmol/L (ref 3.5–5.3)
Sodium: 139 mmol/L (ref 135–146)
Total Bilirubin: 0.5 mg/dL (ref 0.2–1.2)
Total Protein: 6.3 g/dL (ref 6.1–8.1)

## 2024-04-29 LAB — CBC WITH DIFFERENTIAL/PLATELET
Absolute Lymphocytes: 2120 {cells}/uL (ref 850–3900)
Absolute Monocytes: 422 {cells}/uL (ref 200–950)
Basophils Absolute: 31 {cells}/uL (ref 0–200)
Basophils Relative: 0.5 %
Eosinophils Absolute: 118 {cells}/uL (ref 15–500)
Eosinophils Relative: 1.9 %
HCT: 37.3 % (ref 35.0–45.0)
Hemoglobin: 12.2 g/dL (ref 11.7–15.5)
MCH: 29.8 pg (ref 27.0–33.0)
MCHC: 32.7 g/dL (ref 32.0–36.0)
MCV: 91 fL (ref 80.0–100.0)
MPV: 10.6 fL (ref 7.5–12.5)
Monocytes Relative: 6.8 %
Neutro Abs: 3509 {cells}/uL (ref 1500–7800)
Neutrophils Relative %: 56.6 %
Platelets: 269 10*3/uL (ref 140–400)
RBC: 4.1 10*6/uL (ref 3.80–5.10)
RDW: 12.2 % (ref 11.0–15.0)
Total Lymphocyte: 34.2 %
WBC: 6.2 10*3/uL (ref 3.8–10.8)

## 2024-04-29 LAB — MICROALBUMIN / CREATININE URINE RATIO
Creatinine, Urine: 63 mg/dL (ref 20–275)
Microalb Creat Ratio: 8 mg/g{creat} (ref <30)
Microalb, Ur: 0.5 mg/dL

## 2024-04-29 LAB — TSH: TSH: 1.7 m[IU]/L (ref 0.40–4.50)

## 2024-04-29 LAB — LIPID PANEL
Cholesterol: 153 mg/dL (ref <200)
HDL: 59 mg/dL (ref >=50)
LDL Cholesterol (Calc): 75 mg/dL
Non-HDL Cholesterol (Calc): 94 mg/dL (ref <130)
Total CHOL/HDL Ratio: 2.6 (calc) (ref <5.0)
Triglycerides: 100 mg/dL (ref <150)

## 2024-04-29 LAB — HEMOGLOBIN A1C
Hgb A1c MFr Bld: 5.9 % — ABNORMAL HIGH (ref <5.7)
Mean Plasma Glucose: 123 mg/dL
eAG (mmol/L): 6.8 mmol/L

## 2024-05-01 ENCOUNTER — Ambulatory Visit: Payer: Medicare Other | Admitting: Internal Medicine

## 2024-05-01 ENCOUNTER — Encounter: Payer: Self-pay | Admitting: Internal Medicine

## 2024-05-01 VITALS — BP 166/92 | HR 91 | Temp 98.0°F | Ht 61.0 in | Wt 169.1 lb

## 2024-05-01 DIAGNOSIS — K580 Irritable bowel syndrome with diarrhea: Secondary | ICD-10-CM | POA: Diagnosis not present

## 2024-05-01 DIAGNOSIS — E781 Pure hyperglyceridemia: Secondary | ICD-10-CM | POA: Diagnosis not present

## 2024-05-01 DIAGNOSIS — R7302 Impaired glucose tolerance (oral): Secondary | ICD-10-CM

## 2024-05-01 DIAGNOSIS — I10 Essential (primary) hypertension: Secondary | ICD-10-CM

## 2024-05-01 DIAGNOSIS — E559 Vitamin D deficiency, unspecified: Secondary | ICD-10-CM

## 2024-05-01 DIAGNOSIS — K219 Gastro-esophageal reflux disease without esophagitis: Secondary | ICD-10-CM | POA: Diagnosis not present

## 2024-05-01 DIAGNOSIS — Z0000 Encounter for general adult medical examination without abnormal findings: Secondary | ICD-10-CM | POA: Diagnosis not present

## 2024-05-01 DIAGNOSIS — M818 Other osteoporosis without current pathological fracture: Secondary | ICD-10-CM

## 2024-05-01 DIAGNOSIS — K449 Diaphragmatic hernia without obstruction or gangrene: Secondary | ICD-10-CM

## 2024-05-01 DIAGNOSIS — R002 Palpitations: Secondary | ICD-10-CM

## 2024-05-01 LAB — POC URINALSYSI DIPSTICK (AUTOMATED)
Bilirubin, UA: NEGATIVE
Blood, UA: NEGATIVE
Glucose, UA: NEGATIVE
Ketones, UA: NEGATIVE
Leukocytes, UA: NEGATIVE
Nitrite, UA: NEGATIVE
Protein, UA: NEGATIVE
Spec Grav, UA: 1.015 (ref 1.010–1.025)
Urobilinogen, UA: 0.2 U/dL
pH, UA: 6 (ref 5.0–8.0)

## 2024-05-01 NOTE — Progress Notes (Signed)
 Annual Wellness Visit   Patient Care Team: Tailor Lucking, Ronal PARAS, MD as PCP - General (Internal Medicine) Ladona Heinz, MD as PCP - Cardiology (Cardiology)  Visit Date: 05/01/24   Chief Complaint  Patient presents with   Annual Exam   Subjective:  Patient: Linda Sosa, Female DOB: 1946/04/13, 78 y.o. MRN: [id number] Vitals:   05/01/24 1104 05/01/24 1141 05/01/24 1145  BP: (!) 140/86 (!) 160/90 (!) 166/92   Linda Sosa is a 78 y.o. Female who presents today for her Annual Wellness Visit. Patient has Hyperlipidemia; Osteoporosis; H/O Vitamin D  Deficiency; Anemia; Primary Hypertension; Chest Pain; DNR (Do Not Resuscitate); Palpitations; Abnormal Cardiac Enzyme Level; PSVT (Paroxysmal Supraventricular Tachycardia) (HCC); And Left Ventricular Hypertrophy.  On CT Coronary Morph 03/23/2024, large volume hiatal hernia containing majority of the stomach noted.   History of Hypertension treated with Losartan  100 mg daily. Blood Pressure: elevated today at 140/86, 37 minutes later 160/90, and 4 minutes later 166/92. Says that her blood pressures at home are overall normal. Palpitations; PSVT treated with Metoprolol  succinate 50 mg daily.   History of Hyperlipidemia treated with Simvastatin  20 mg at bedtime and Zetia  10 mg daily. 04/28/2024 Lipid Panel: WNL. 03/23/2024 Coronary Calcium  Score: 147. S/p Left Heart Catheterization & Coronary Angiography 11/18/2022.   History of Impaired Glucose Tolerance with 04/28/2024 HgbA1c 5.9, elevated from 5.8 in January 2024, but decreased from 6.1 in December 2024; Glucose 101, elevated from 98 April 29th, 2025.   History of Vitamin-D Deficiency treated with Vitamin-D daily.  History of Anemia treated with Ferrous Sulfate  325 mg daily.   Labs 04/28/2024 CBC: WNL CMP: WNL except Glucose  TSH: 1.70  Mammogram 09/09/2023  with possible asymmetry, left breast with repeat recommendation of diagnostic mammogram, possible US .  Colonoscopy 12/14/2012 noted mild  Diverticulosis in sigmoid with associated tortuosity; visualized a small cecal lipoma; exam otherwise normal without repeat recommendation currently.  Bone Density 06/13/2019 T-score AP Spine -2.8, osteoporotic. History of Osteoporosis managed with Vitamin-D supplements and Calcium  Carbonate 600 mg daily.    Vaccine Counseling: Due for Shingles 1/2 and Tdap - postponed; UTD on Flu and PNA. Past Medical History:  Diagnosis Date   Anemia    Hyperlipidemia    Hypertension    Osteoporosis    Vitamin D  deficiency    Medical/Surgical History Narrative:   Allergic/Intolerant to: No Known Allergies  1997 - Cholecystectomy   1971 & 1991 - Right Breast Excisional Biopsy  1952 - Tonsillectomy  Family History  Problem Relation Age of Onset   Cancer Mother    Diabetes Father    Heart disease Father    Coronary artery disease Father 109   Coronary artery disease Brother 74   Colon cancer Neg Hx    Stomach cancer Neg Hx    Breast cancer Neg Hx    Osteoporosis Neg Hx    Thyroid  disease Neg Hx    Family History Narrative: No Family History of Colon/Stomach Cancer, Breast Cancer, Osteoporosis, or Thyroid  Disease. Father, deceased aged 47, w/ hx of Coronary Artery Disease/Heart Disease and Diabetes Mother, deceased aged 24 due to Ovarian Cancer Brother, deceased w/ hx of Coronary Artery Disease (onset aged 31) 2 Sisters - 1 w/ hx of Diabetes, otherwise both are in fairly good health as far as is known Daughter in good health as far as is known Social History   Social History Narrative   Widowed - husband passed due to complications of Non-Hodgkin's Lymphoma. She enjoys gardening. Completed 2 years  of college. Non-smoker or drinker. Has 1 daughter.  Review of Systems  Constitutional:  Negative for chills, fever, malaise/fatigue and weight loss.  HENT:  Negative for hearing loss, sinus pain and sore throat.   Respiratory:  Negative for cough, hemoptysis and shortness of breath.    Cardiovascular:  Negative for chest pain, palpitations, leg swelling and PND.  Gastrointestinal:  Negative for abdominal pain, constipation, diarrhea, heartburn, nausea and vomiting.  Genitourinary:  Negative for dysuria, frequency and urgency.  Musculoskeletal:  Negative for back pain, myalgias and neck pain.  Skin:  Negative for itching and rash.  Neurological:  Negative for dizziness, tingling, seizures and headaches.  Endo/Heme/Allergies:  Negative for polydipsia.  Psychiatric/Behavioral:  Negative for depression. The patient is not nervous/anxious.     Objective:  Vitals: BP (!) 166/92 (BP Location: Right Arm, Patient Position: Sitting)   Pulse 91   Temp 98 F (36.7 C) (Temporal)   Ht 5' 1 (1.549 m)   Wt 169 lb 1.9 oz (76.7 kg)   SpO2 93%   BMI 31.95 kg/m  Physical Exam Vitals and nursing note reviewed.  Constitutional:      General: She is not in acute distress.    Appearance: Normal appearance. She is not ill-appearing or toxic-appearing.  HENT:     Head: Normocephalic and atraumatic.     Right Ear: Hearing, tympanic membrane, ear canal and external ear normal.     Left Ear: Hearing, tympanic membrane, ear canal and external ear normal.     Mouth/Throat:     Pharynx: Oropharynx is clear.   Eyes:     Extraocular Movements: Extraocular movements intact.     Pupils: Pupils are equal, round, and reactive to light.   Neck:     Thyroid : No thyroid  mass, thyromegaly or thyroid  tenderness.     Vascular: No carotid bruit.   Cardiovascular:     Rate and Rhythm: Normal rate and regular rhythm. No extrasystoles are present.    Pulses:          Dorsalis pedis pulses are 2+ on the right side and 2+ on the left side.       Posterior tibial pulses are 2+ on the right side and 2+ on the left side.     Heart sounds: Normal heart sounds. No murmur heard.    No friction rub. No gallop.  Pulmonary:     Effort: Pulmonary effort is normal.     Breath sounds: Normal breath sounds.  No decreased breath sounds, wheezing, rhonchi or rales.  Chest:     Chest wall: No mass.  Abdominal:     Palpations: Abdomen is soft. There is no hepatomegaly, splenomegaly or mass.     Tenderness: There is no abdominal tenderness.     Hernia: No hernia is present.   Musculoskeletal:     Cervical back: Normal range of motion.     Right lower leg: No edema.     Left lower leg: No edema.  Lymphadenopathy:     Cervical: No cervical adenopathy.     Upper Body:     Right upper body: No supraclavicular adenopathy.     Left upper body: No supraclavicular adenopathy.   Skin:    General: Skin is warm and dry.   Neurological:     General: No focal deficit present.     Mental Status: She is alert and oriented to person, place, and time. Mental status is at baseline.     Sensory: Sensation is  intact.     Motor: Motor function is intact. No weakness.     Deep Tendon Reflexes: Reflexes are normal and symmetric.   Psychiatric:        Attention and Perception: Attention normal.        Mood and Affect: Mood normal.        Speech: Speech normal.        Behavior: Behavior normal.        Thought Content: Thought content normal.        Cognition and Memory: Cognition normal.        Judgment: Judgment normal.   Most Recent Functional Status Assessment:    05/01/2024   11:00 AM  In your present state of health, do you have any difficulty performing the following activities:  Hearing? 0  Vision? 0  Difficulty concentrating or making decisions? 0  Walking or climbing stairs? 0  Doing errands, shopping? 0  Preparing Food and eating ? N  Using the Toilet? N  In the past six months, have you accidently leaked urine? N  Do you have problems with loss of bowel control? N  Managing your Medications? N  Managing your Finances? N  Housekeeping or managing your Housekeeping? N   Most Recent Fall Risk Assessment:    05/01/2024   11:02 AM  Fall Risk   Falls in the past year? 0  Number falls in  past yr: 0  Injury with Fall? 0  Risk for fall due to : No Fall Risks  Follow up Falls evaluation completed   Most Recent Depression Screenings:    05/01/2024   11:03 AM 02/17/2024    2:15 PM  PHQ 2/9 Scores  PHQ - 2 Score 0 0   Most Recent Cognitive Screening:    05/01/2024   11:04 AM  6CIT Screen  What Year? 0 points  What month? 0 points  What time? 0 points  Count back from 20 0 points  Months in reverse 0 points  Repeat phrase 0 points  Total Score 0 points   Results:  Studies Obtained And Personally Reviewed By Me:  On CT Coronary Morph 03/23/2024, large volume hiatal hernia containing majority of the stomach noted.   Mammogram 09/09/2023  with possible asymmetry, left breast.  Colonoscopy 12/14/2012 noted mild Diverticulosis in sigmoid with associated tortuosity; visualized a small cecal lipoma; exam otherwise normal.  Bone Density 06/13/2019 T-score AP Spine -2.8, osteoporotic.   Labs:     Component Value Date/Time   NA 139 04/28/2024 0928   NA 141 03/07/2024 0940   K 4.7 04/28/2024 0928   CL 104 04/28/2024 0928   CO2 27 04/28/2024 0928   GLUCOSE 101 (H) 04/28/2024 0928   BUN 21 04/28/2024 0928   BUN 21 03/07/2024 0940   CREATININE 0.71 04/28/2024 0928   CALCIUM  9.5 04/28/2024 0928   PROT 6.3 04/28/2024 0928   PROT 6.3 03/07/2024 0940   ALBUMIN 4.3 03/07/2024 0940   AST 17 04/28/2024 0928   ALT 11 04/28/2024 0928   ALKPHOS 77 03/07/2024 0940   BILITOT 0.5 04/28/2024 0928   BILITOT 0.3 03/07/2024 0940   GFRNONAA >60 02/12/2024 0423   GFRNONAA 87 03/15/2020 0908   GFRAA 101 03/15/2020 0908    Lab Results  Component Value Date   WBC 6.2 04/28/2024   HGB 12.2 04/28/2024   HCT 37.3 04/28/2024   MCV 91.0 04/28/2024   PLT 269 04/28/2024   Lab Results  Component Value Date   CHOL  153 04/28/2024   HDL 59 04/28/2024   LDLCALC 75 04/28/2024   TRIG 100 04/28/2024   CHOLHDL 2.6 04/28/2024   Lab Results  Component Value Date   HGBA1C 5.9 (H)  04/28/2024    Lab Results  Component Value Date   TSH 1.70 04/28/2024    Assessment & Plan:   Orders Placed This Encounter  Procedures   POCT Urinalysis Dipstick (Automated)  Other Labs Reviewed today: CBC: WNL CMP: WNL except Glucose  TSH: 1.70  Hiatal Hernia: noted on CT Coronary Morph 03/23/2024 with large volume hiatal hernia containing majority of the stomach noted.   Hypertension treated with Losartan  100 mg daily. Blood Pressure: elevated today at 140/86, 37 minutes later 160/90, and 4 minutes later 166/92. Says that her blood pressures at home are overall normal.   Palpitations; PSVT treated with Metoprolol  succinate 50 mg daily.   Hyperlipidemia treated with Simvastatin  20 mg at bedtime and Zetia  10 mg daily. 04/28/2024 Lipid Panel: WNL. 03/23/2024 Coronary Calcium  Score: 147. S/p Left Heart Catheterization & Coronary Angiography 11/18/2022.   Impaired Glucose Tolerance with 04/28/2024 HgbA1c 5.9, elevated from 5.8 in January 2024, but decreased from 6.1 in December 2024; Glucose 101, elevated from 98 April 29th, 2025.   Vitamin-D Deficiency treated with Vitamin-D daily.   Hx of Anemia treated with Ferrous Sulfate  325 mg daily. Currently not anemic  Mammogram 09/09/2023  with possible asymmetry, left breast with repeat recommendation of diagnostic mammogram, possible US .  Colonoscopy 12/14/2012 noted mild Diverticulosis in sigmoid with associated tortuosity; visualized a small cecal lipoma; exam otherwise normal without repeat recommendation currently.  Bone Density 06/13/2019 T-score AP Spine -2.8, osteoporotic.   Osteoporosis managed with Vitamin-D supplements and Calcium  Carbonate 600 mg daily.    Vaccine Counseling: Due for Shingles 1/2 and Tdap - postponed; UTD on Flu and PNA.     Annual wellness visit done today including the all of the following: Reviewed patient's Family Medical History Reviewed and updated list of patient's medical providers Assessment of  cognitive impairment was done Assessed patient's functional ability Established a written schedule for health screening services Health Risk Assessent Completed and Reviewed  Discussed health benefits of physical activity, and encouraged her to engage in regular exercise appropriate for her age and condition.    I,Emily Lagle,acting as a Neurosurgeon for Ronal JINNY Hailstone, MD.,have documented all relevant documentation on the behalf of Ronal JINNY Hailstone, MD,as directed by  Ronal JINNY Hailstone, MD while in the presence of Ronal JINNY Hailstone, MD.   I, Ronal JINNY Hailstone, MD, have reviewed all documentation for this visit. The documentation on 05/07/24 for the exam, diagnosis, procedures, and orders are all accurate and complete.

## 2024-05-01 NOTE — Patient Instructions (Addendum)
 Please do BP checks at home twice daily for 5 days and call us  with results. Otherwise return in 6 months and continue home blood pressure checks. Please call if you have questions or concerns.

## 2024-05-07 ENCOUNTER — Encounter: Payer: Self-pay | Admitting: Internal Medicine

## 2024-05-07 NOTE — Addendum Note (Signed)
 Addended by: PERRI RONAL PARAS on: 05/07/2024 08:58 PM   Modules accepted: Level of Service

## 2024-05-08 ENCOUNTER — Other Ambulatory Visit (HOSPITAL_COMMUNITY)

## 2024-05-08 ENCOUNTER — Telehealth: Payer: Self-pay

## 2024-05-08 NOTE — Telephone Encounter (Signed)
 Patient called in to let us  know recent BP.   Reason for CRM: Pt calling in blood pressure Wed. 05/03/2024:124/72, 125/74, Thurs 6/26: 129/70,127/72, Fri. 6/27: 128/61, 127/62, Sat.6/28: 877/27, 128/68, Sun.6/29: 123/64/,121/70.

## 2024-05-09 ENCOUNTER — Ambulatory Visit: Attending: Cardiology | Admitting: Cardiology

## 2024-05-09 ENCOUNTER — Encounter: Payer: Self-pay | Admitting: Cardiology

## 2024-05-09 ENCOUNTER — Ambulatory Visit: Admitting: Physician Assistant

## 2024-05-09 VITALS — BP 132/72 | HR 82 | Resp 16 | Ht 61.0 in | Wt 168.4 lb

## 2024-05-09 DIAGNOSIS — I471 Supraventricular tachycardia, unspecified: Secondary | ICD-10-CM | POA: Diagnosis not present

## 2024-05-09 DIAGNOSIS — I4729 Other ventricular tachycardia: Secondary | ICD-10-CM

## 2024-05-09 DIAGNOSIS — I10 Essential (primary) hypertension: Secondary | ICD-10-CM | POA: Diagnosis not present

## 2024-05-09 NOTE — Progress Notes (Signed)
 Cardiology Office Note:  .   Date:  05/09/2024  ID:  Linda Sosa, DOB 31-Oct-1946, MRN [id number] PCP: Perri Ronal PARAS, MD  [facility] Providers Cardiologist:  Gordy Bergamo, MD   History of Present Illness: Linda Sosa is a 78 y.o. female with visit-pertinent history of minimal CAD on cardiac catheterization in 11/2022, hypertension, hyperlipidemia, anemia, PSVT, hyperdynamic LVEF with basal septal hypertrophy.  During hospitalization with chest pain on 02/11/2024, she did develop SVT that spontaneously resolved with resolution of her symptoms of chest pain and felt that her chest pain and palpitations were related to SVT.  She now presents for follow-up.  Discussed the use of AI scribe software for clinical note transcription with the patient, who gave verbal consent to proceed.  History of Present Illness Linda Sosa is a 78 year old female with PSVT who presents for follow-up regarding heart racing symptoms and dyspnea.  No recent episodes of heart racing have occurred since starting metoprolol . Hospitalization in April for chest pain and rapid heartbeat led to the initiation of metoprolol . Current medications include metoprolol  and losartan , taken regularly. She denies dyspnea on exertion since hospital discharge and no specific complaints today.   They deny having sleep apnea and have never been tested for it. They are unsure if they snore, as they live alone. They occasionally take naps in the afternoon but sleep well otherwise.  Labs   Lab Results  Component Value Date   CHOL 153 04/28/2024   HDL 59 04/28/2024   LDLCALC 75 04/28/2024   TRIG 100 04/28/2024   CHOLHDL 2.6 04/28/2024   Lab Results  Component Value Date   NA 139 04/28/2024   K 4.7 04/28/2024   CO2 27 04/28/2024   GLUCOSE 101 (H) 04/28/2024   BUN 21 04/28/2024   CREATININE 0.71 04/28/2024   CALCIUM  9.5 04/28/2024   GFR 90.15 09/12/2018   EGFR 93 03/07/2024   GFRNONAA >60 02/12/2024       Latest Ref Rng & Units 04/28/2024    9:28 AM 03/07/2024    9:40 AM 02/12/2024    4:23 AM  BMP  Glucose 65 - 99 mg/dL 898  98  888   BUN 7 - 25 mg/dL 21  21  15    Creatinine 0.60 - 1.00 mg/dL 9.28  9.43  9.35   BUN/Creat Ratio 6 - 22 (calc) SEE NOTE:  38    Sodium 135 - 146 mmol/L 139  141  141   Potassium 3.5 - 5.3 mmol/L 4.7  4.5  4.0   Chloride 98 - 110 mmol/L 104  103  106   CO2 20 - 32 mmol/L 27  25  27    Calcium  8.6 - 10.4 mg/dL 9.5  9.5  9.2       Latest Ref Rng & Units 04/28/2024    9:28 AM 03/07/2024    9:40 AM 02/11/2024    8:26 PM  CBC  WBC 3.8 - 10.8 Thousand/uL 6.2  6.8  8.5   Hemoglobin 11.7 - 15.5 g/dL 87.7  86.7  87.5   Hematocrit 35.0 - 45.0 % 37.3  41.3  37.7   Platelets 140 - 400 Thousand/uL 269  267  259    Lab Results  Component Value Date   HGBA1C 5.9 (H) 04/28/2024    Lab Results  Component Value Date   TSH 1.70 04/28/2024     ROS  Review of Systems  Cardiovascular:  Negative for chest pain,  dyspnea on exertion and leg swelling.    Physical Exam:   VS:  BP 132/72 (BP Location: Left Arm, Patient Position: Sitting, Cuff Size: Large)   Pulse 82   Resp 16   Ht 5' 1 (1.549 m)   Wt 168 lb 6.4 oz (76.4 kg)   SpO2 94%   BMI 31.82 kg/m    Wt Readings from Last 3 Encounters:  05/09/24 168 lb 6.4 oz (76.4 kg)  05/01/24 169 lb 1.9 oz (76.7 kg)  03/07/24 166 lb (75.3 kg)    Physical Exam Constitutional:      Appearance: She is obese.  Neck:     Vascular: No carotid bruit or JVD.  Cardiovascular:     Rate and Rhythm: Normal rate and regular rhythm.     Pulses: Intact distal pulses.     Heart sounds: Normal heart sounds. No murmur heard.    No gallop.  Pulmonary:     Effort: Pulmonary effort is normal.     Breath sounds: Normal breath sounds.  Abdominal:     General: Bowel sounds are normal.     Palpations: Abdomen is soft.  Musculoskeletal:     Right lower leg: No edema.     Left lower leg: No edema.     CARDIAC CATHETERIZATION 11/18/2022     ECHOCARDIOGRAM COMPLETE 02/12/2024  1. Left ventricular ejection fraction, by estimation, is 65 to 70%. Left ventricular ejection fraction by PLAX is 68 %. The left ventricle has normal function. The left ventricle has no regional wall motion abnormalities. There is moderate asymmetric left ventricular hypertrophy of the basal-septal segment. Left ventricular diastolic parameters are consistent with Grade I diastolic dysfunction (impaired relaxation). 2. Right ventricular systolic function is hyperdynamic. The right ventricular size is normal. There is normal pulmonary artery systolic pressure. The estimated right ventricular systolic pressure is 27.2 mmHg. 3. The mitral valve is grossly normal. Trivial mitral valve regurgitation. 4. The aortic valve is tricuspid. Aortic valve regurgitation is not visualized. No aortic stenosis is present.  Extended outpatient EKG monitoring 13 days starting 03/10/2024 for chest pain: Predominant rhythm sinus rhythm.  Minimum heart rate 55 bpm at 9:13 PM and maximum 137 bpm at 11:05 AM with a average heartbeat of 75 bpm. There was 1 nonsustained ventricular tachycardia, 10 beats at 2:38 PM, asymptomatic. There were 35 SVT episodes longest 21 seconds, EKG consistent with atrial tachycardia. Occasional PACs, atrial triplets and PVCs and rare ventricular couplets and triplets were present. No atrial fibrillation, no heart block. Patient triggered events correlated with PACs.      Hospital Tele:    CT CORONARY MORPH W/CTA COR W/SCORE 03/23/2024  1. Coronary calcium  score of 147.  Total plaque volume 299 mm3 which is 44 percentile for age- and sex-matched controls (calcified plaque 37 mm3; non-calcified plaque 257mm3). TPV is (severe).  2. Normal coronary origin with left dominance.   3. Mild proximal LAD stenosis with mixed plaque 30-49%, mild proximal vessel circumflex stenosis 30-49%, mild stenosis of small caliber non dominant right coronary artery of  30-49%.   4. Large hiatal hernia.   EKG:         Medications ordered    No orders of the defined types were placed in this encounter.    ASSESSMENT AND PLAN: .      ICD-10-CM   1. PSVT (paroxysmal supraventricular tachycardia) (HCC)  I47.10     2. NSVT (nonsustained ventricular tachycardia) (HCC)  I47.29     3. Primary hypertension  I10      Assessment & Plan Paroxysmal Supraventricular Tachycardia (PSVT) PSVT episodes occurred during hospitalization in April with chest pain. Since initiating metoprolol , no further episodes have been reported. The condition is well-managed with medication. - Continue metoprolol  succinate 50 mg once daily. - Instruct on taking an extra dose of metoprolol  if PSVT occurs. - Educate on performing vagal maneuvers such as bearing down or doing a crunch. - Advise to seek emergency care if symptoms persist or worsen. - Encourage daily exercise, such as walking.  Non-sustained Ventricular Tachycardia Non-sustained ventricular tachycardia identified but not clinically significant due to absence of significant coronary artery disease and normal echocardiogram findings. - Monitor for symptoms and report any prolonged or severe episodes. - Consider sleep study if snoring is reported by family, to evaluate for sleep apnea as a potential contributing factor.  Mild Coronary Artery Disease Mild coronary artery disease with well-controlled cholesterol levels. - Continue current cholesterol medication. - Encourage weight loss to support heart health.  Large Hiatal Hernia A large hiatal hernia is present.  Could be contributing to dyspnea and chest pain as well.  General Health Maintenance Emphasized the importance of maintaining a healthy lifestyle to support cardiac health. - Encourage regular follow-up appointments to monitor cardiac stability and address any new or worsening symptoms. - Instruct to call the office if experiencing prolonged or severe  episodes of tachycardia. Office visit on a as needed basis.  Signed,  Gordy Bergamo, MD, [facility] 05/09/2024, 9:34 PM [facility] [address] [place], [place] [address] Phone: [phone]. Fax:  [phone]

## 2024-05-09 NOTE — Patient Instructions (Signed)

## 2024-06-30 ENCOUNTER — Other Ambulatory Visit: Payer: Self-pay | Admitting: Internal Medicine

## 2024-09-05 NOTE — Progress Notes (Unsigned)
   09/05/2024  Patient ID: Linda Sosa Favorite, female   DOB: [birth year], 78 y.o.   MRN: [id number]  This patient is appearing on a report for being at risk of failing the adherence measure for cholesterol (statin) medications this calendar year.   Medication: simvastatin  20 mg tablets Last fill date: 04/08/24 for 90 day supply  MyChart message sent to patient.  Taylr Meuth C. K[facility] PharmD Candidate Class of [phone]

## 2024-11-07 ENCOUNTER — Other Ambulatory Visit: Payer: Self-pay | Admitting: Internal Medicine
